# Patient Record
Sex: Female | Born: 1995 | Race: White | Hispanic: No | Marital: Single | Smoking: Current every day smoker
Health system: Southern US, Community
[De-identification: ages and names within clinical notes are randomized; demographics above are authoritative.]

## PROBLEM LIST (undated history)

## (undated) DIAGNOSIS — N898 Other specified noninflammatory disorders of vagina: Secondary | ICD-10-CM

## (undated) DIAGNOSIS — Z8619 Personal history of other infectious and parasitic diseases: Secondary | ICD-10-CM

## (undated) DIAGNOSIS — B9689 Other specified bacterial agents as the cause of diseases classified elsewhere: Secondary | ICD-10-CM

## (undated) DIAGNOSIS — Z319 Encounter for procreative management, unspecified: Secondary | ICD-10-CM

## (undated) DIAGNOSIS — N760 Acute vaginitis: Secondary | ICD-10-CM

## (undated) HISTORY — DX: Personal history of other infectious and parasitic diseases: Z86.19

## (undated) HISTORY — DX: Encounter for procreative management, unspecified: Z31.9

## (undated) HISTORY — DX: Other specified bacterial agents as the cause of diseases classified elsewhere: B96.89

## (undated) HISTORY — DX: Acute vaginitis: N76.0

## (undated) HISTORY — DX: Other specified noninflammatory disorders of vagina: N89.8

---
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
## 2013-10-19 ENCOUNTER — Encounter (HOSPITAL_COMMUNITY): Payer: Self-pay | Admitting: Emergency Medicine

## 2013-10-19 ENCOUNTER — Emergency Department (HOSPITAL_COMMUNITY)
Admission: EM | Admit: 2013-10-19 | Discharge: 2013-10-19 | Disposition: A | Discharge status: Home / Self Care | Payer: Medicaid Other | Attending: Emergency Medicine | Admitting: Emergency Medicine

## 2013-10-19 DIAGNOSIS — Z3202 Encounter for pregnancy test, result negative: Secondary | ICD-10-CM | POA: Insufficient documentation

## 2013-10-19 DIAGNOSIS — F17200 Nicotine dependence, unspecified, uncomplicated: Secondary | ICD-10-CM | POA: Insufficient documentation

## 2013-10-19 DIAGNOSIS — Z202 Contact with and (suspected) exposure to infections with a predominantly sexual mode of transmission: Secondary | ICD-10-CM | POA: Insufficient documentation

## 2013-10-19 LAB — URINALYSIS, ROUTINE W REFLEX MICROSCOPIC
Bilirubin Urine: NEGATIVE
Protein, ur: NEGATIVE mg/dL
Urobilinogen, UA: 0.2 mg/dL (ref 0.0–1.0)

## 2013-10-19 LAB — WET PREP, GENITAL
Clue Cells Wet Prep HPF POC: NONE SEEN
Trich, Wet Prep: NONE SEEN

## 2013-10-19 LAB — URINE MICROSCOPIC-ADD ON

## 2013-10-19 MED ORDER — LIDOCAINE HCL (PF) 1 % IJ SOLN
INTRAMUSCULAR | Status: AC
  Administered 2013-10-19: 5 mL
  Filled 2013-10-19: qty 5

## 2013-10-19 MED ORDER — AZITHROMYCIN 250 MG PO TABS
1000.0000 mg | ORAL_TABLET | Freq: Once | ORAL | Status: AC
  Administered 2013-10-19: 1000 mg via ORAL
  Filled 2013-10-19: qty 4

## 2013-10-19 MED ORDER — CEFTRIAXONE SODIUM 250 MG IJ SOLR
250.0000 mg | Freq: Once | INTRAMUSCULAR | Status: AC
  Administered 2013-10-19: 250 mg via INTRAMUSCULAR
  Filled 2013-10-19: qty 250

## 2013-10-19 NOTE — ED Notes (Signed)
Pt says her ex boyfriend tested pos for chladymia,  .  Had brown d/c and itches

## 2013-10-19 NOTE — ED Notes (Signed)
Patient reports she was contact by ex-boyfriend, stating that he had tested positive for chlamydia. Patient does report brown discharge and itching. Denies any lesions. Per patient thought it was yeast infection, in which she used over-the-counter medication. Per patient some relief in itching.

## 2013-10-19 NOTE — ED Provider Notes (Signed)
Medical screening examination/treatment/procedure(s) were performed by non-physician practitioner and as supervising physician I was immediately available for consultation/collaboration.  EKG Interpretation   None         Shelda Jakes, MD 10/19/13 1434

## 2013-10-19 NOTE — ED Provider Notes (Signed)
CSN: [id number]     Arrival date & time 10/19/13  0944 History   First MD Initiated Contact with Patient 10/19/13 1019     Chief Complaint  Patient presents with  . Exposure to STD   (Consider location/radiation/quality/duration/timing/severity/associated sxs/prior Treatment) Patient is a 17 y.o. female presenting with STD exposure. The history is provided by the patient.  Exposure to STD Pertinent negatives include no abdominal pain, arthralgias, chest pain, coughing or neck pain.    History reviewed. No pertinent past medical history. History reviewed. No pertinent past surgical history. History reviewed. No pertinent family history. History  Substance Use Topics  . Smoking status: Current Every Day Smoker -- 1.00 packs/day for 8 years    Types: Cigarettes  . Smokeless tobacco: Never Used  . Alcohol Use: No   OB History   Grav Para Term Preterm Abortions TAB SAB Ect Mult Living   1 1  1      1      Review of Systems  Constitutional: Negative for activity change.       All ROS Neg except as noted in HPI  HENT: Negative for nosebleeds.   Eyes: Negative for photophobia and discharge.  Respiratory: Negative for cough, shortness of breath and wheezing.   Cardiovascular: Negative for chest pain and palpitations.  Gastrointestinal: Negative for abdominal pain and blood in stool.  Genitourinary: Negative for dysuria, frequency and hematuria.  Musculoskeletal: Negative for arthralgias, back pain and neck pain.  Skin: Negative.   Neurological: Negative for dizziness, seizures and speech difficulty.  Psychiatric/Behavioral: Negative for hallucinations and confusion.    Allergies  Review of patient's allergies indicates no known allergies.  Home Medications  No current outpatient prescriptions on file. BP 127/61  Pulse 77  Temp(Src) 97.8 F (36.6 C) (Oral)  Resp 18  Ht 5\' 2"  (1.575 m)  Wt 170 lb (77.111 kg)  BMI 31.09 kg/m2  SpO2 100%  LMP 09/22/2013 Physical Exam   Nursing note and vitals reviewed. Constitutional: She is oriented to person, place, and time. She appears well-developed and well-nourished.  Non-toxic appearance.  HENT:  Head: Normocephalic.  Right Ear: Tympanic membrane and external ear normal.  Left Ear: Tympanic membrane and external ear normal.  Eyes: EOM and lids are normal. Pupils are equal, round, and reactive to light.  Neck: Normal range of motion. Neck supple. Carotid bruit is not present.  Cardiovascular: Normal rate, regular rhythm, normal heart sounds, intact distal pulses and normal pulses.   Pulmonary/Chest: Breath sounds normal. No respiratory distress.  Abdominal: Soft. Bowel sounds are normal. There is no tenderness. There is no guarding.  Genitourinary:  Chaperone present during examination. The external vaginal examination is well within normal limits. There is a small amount of blood in the vaginal vault, but no foreign body. The os of the cervix is closed. There is mucous that is blood-tinged present. The patient informs me that she is at the beginning of her menstrual cycle. There is no significant tenderness with cervical wall motion. There is mild to moderate left adnexal tenderness present.  Musculoskeletal: Normal range of motion.  Lymphadenopathy:       Head (right side): No submandibular adenopathy present.       Head (left side): No submandibular adenopathy present.    She has no cervical adenopathy.  Neurological: She is alert and oriented to person, place, and time. She has normal strength. No cranial nerve deficit or sensory deficit.  Skin: Skin is warm and dry.  Psychiatric: She  has a normal mood and affect. Her speech is normal.    ED Course  Procedures (including critical care time) Labs Review Labs Reviewed  URINALYSIS, ROUTINE W REFLEX MICROSCOPIC  POCT PREGNANCY, URINE   Imaging Review No results found.  EKG Interpretation   None       MDM  No diagnosis found. **I have reviewed  nursing notes, vital signs, and all appropriate lab and imaging results for this patient.*  Patient states that an ex-boyfriend told her he tested positive for Chlamydia. She presents to the emergency department for additional testing. Patient also states that she has a discharge present that she thought was yeast but is not really responding well to over-the-counter medications. The patient also states that she has just started her menstrual cycle.  Gonorrhea and Chlamydia cultures as well as wet prep were obtained. Patient was treated in the emergency department with Rocephin, and Zithromax. Patient advised to be seen at the health department for additional hepatitis HIV testing, etc.  Kathie Dike, PA-C 10/19/13 1416

## 2013-10-21 LAB — URINE CULTURE

## 2013-10-22 NOTE — ED Notes (Signed)
+   Chlamydia Patient treated with Rocephin And Zithromax-[facility] faxed 

## 2013-10-23 ENCOUNTER — Telehealth (HOSPITAL_COMMUNITY): Payer: Self-pay | Admitting: Emergency Medicine

## 2013-11-09 ENCOUNTER — Other Ambulatory Visit: Payer: Self-pay | Admitting: Obstetrics and Gynecology

## 2014-01-22 ENCOUNTER — Ambulatory Visit: Payer: Self-pay | Admitting: Adult Health

## 2014-01-27 ENCOUNTER — Encounter (INDEPENDENT_AMBULATORY_CARE_PROVIDER_SITE_OTHER): Payer: Self-pay

## 2014-01-27 ENCOUNTER — Ambulatory Visit (INDEPENDENT_AMBULATORY_CARE_PROVIDER_SITE_OTHER): Payer: Medicaid Other | Admitting: Adult Health

## 2014-01-27 ENCOUNTER — Encounter: Payer: Self-pay | Admitting: Adult Health

## 2014-01-27 VITALS — BP 128/60 | Ht 63.5 in | Wt 177.0 lb

## 2014-01-27 DIAGNOSIS — Z319 Encounter for procreative management, unspecified: Secondary | ICD-10-CM

## 2014-01-27 DIAGNOSIS — N898 Other specified noninflammatory disorders of vagina: Secondary | ICD-10-CM | POA: Insufficient documentation

## 2014-01-27 HISTORY — DX: Encounter for procreative management, unspecified: Z31.9

## 2014-01-27 HISTORY — DX: Other specified noninflammatory disorders of vagina: N89.8

## 2014-01-27 MED ORDER — PRENATAL VITAMINS PLUS 27-1 MG PO TABS: ORAL_TABLET | ORAL | Status: DC

## 2014-01-27 NOTE — Patient Instructions (Signed)
Discussed timing of sex Take prenatal vitamins Try to decrease cigarettes Return in 4 weeks

## 2014-01-27 NOTE — Progress Notes (Signed)
Subjective:     Patient ID: Lisa Farrell, female   DOB: May 22, 1996, 18 y.o.   MRN: [id number]  HPI Lowanda FosterBrittany is a 18 year old white female, single desires pregnancy and has discharge, was treated for chlamydia 09/2013 at clinic.Has 20 year old son.Used depo in past has not used anything in about 3 years, periods are regular.  Review of Systems See HPI Reviewed past medical,surgical, social and family history. Reviewed medications and allergies.     Objective:   Physical Exam BP 128/60  Ht 5' 3.5" (1.613 m)  Wt 177 lb (80.287 kg)  BMI 30.86 kg/m2  LMP 01/10/2013   Skin warm and dry.Pelvic: external genitalia is normal in appearance, vagina: watery discharge without odor, cervix:smooth and bulbous, uterus: normal size, shape and contour, non tender, no masses felt, adnexa: no masses or tenderness noted.Negative CMT. GC/CHL obtained.  Assessment:    Vaginal discharge Desires pregnancy     Plan:    Discussed timing of sex rx prenatal plus 1 daily x 1 year Review handout on fertility Try to decrease smoking   Return in 4 weeks to discuss

## 2014-01-28 ENCOUNTER — Telehealth: Payer: Self-pay | Admitting: Adult Health

## 2014-01-28 LAB — GC/CHLAMYDIA PROBE AMP
CT PROBE, AMP APTIMA: NEGATIVE
GC Probe RNA: NEGATIVE

## 2014-01-28 NOTE — Telephone Encounter (Signed)
Pt aware of labs  

## 2014-02-26 ENCOUNTER — Ambulatory Visit: Payer: Medicaid Other | Admitting: Adult Health

## 2014-05-21 ENCOUNTER — Encounter: Payer: Self-pay | Admitting: Adult Health

## 2014-05-21 ENCOUNTER — Ambulatory Visit (INDEPENDENT_AMBULATORY_CARE_PROVIDER_SITE_OTHER): Payer: Medicaid Other | Admitting: Adult Health

## 2014-05-21 VITALS — BP 122/60 | Ht 62.0 in | Wt 173.0 lb

## 2014-05-21 DIAGNOSIS — A499 Bacterial infection, unspecified: Secondary | ICD-10-CM

## 2014-05-21 DIAGNOSIS — N760 Acute vaginitis: Secondary | ICD-10-CM

## 2014-05-21 DIAGNOSIS — B9689 Other specified bacterial agents as the cause of diseases classified elsewhere: Secondary | ICD-10-CM

## 2014-05-21 DIAGNOSIS — N898 Other specified noninflammatory disorders of vagina: Secondary | ICD-10-CM

## 2014-05-21 HISTORY — DX: Other specified bacterial agents as the cause of diseases classified elsewhere: B96.89

## 2014-05-21 LAB — POCT WET PREP (WET MOUNT)
Trichomonas Wet Prep HPF POC: NEGATIVE
WBC WET PREP: POSITIVE

## 2014-05-21 MED ORDER — METRONIDAZOLE 500 MG PO TABS: 500.0000 mg | ORAL_TABLET | Freq: Two times a day (BID) | ORAL | Status: DC

## 2014-05-21 NOTE — Progress Notes (Signed)
Subjective:     Patient ID: Kern ReapBrittany N Reich, female   DOB: September 07, 1996, 18 y.o.   MRN: [id number]  HPI Lowanda FosterBrittany is a 18 year old white female in wanting to get checked,has had exposure to trich , and complains of discharge with odor.  Review of Systems See HPI Reviewed past medical,surgical, social and family history. Reviewed medications and allergies.     Objective:   Physical Exam BP 122/60  Ht 5\' 2"  (1.575 m)  Wt 173 lb (78.472 kg)  BMI 31.63 kg/m2  LMP 04/22/2014   Skin warm and dry.Pelvic: external genitalia is normal in appearance, vagina: white, slightly frothy discharge with odor, cervix:smooth, uterus: normal size, shape and contour, non tender, no masses felt, adnexa: no masses or tenderness noted. Wet prep: + for clue cells and +WBCs.No trich GC/CHL obtained.   Assessment:     Vaginal discharge BV    Plan:    GC/CHL sent Rx flagyl 500 mg 1 bid x 7 days, no alcohol, review handout on BV, no sex white on meds.   Follow up prn

## 2014-05-21 NOTE — Patient Instructions (Signed)
Bacterial Vaginosis Bacterial vaginosis is a vaginal infection that occurs when the normal balance of bacteria in the vagina is disrupted. It results from an overgrowth of certain bacteria. This is the most common vaginal infection in women of childbearing age. Treatment is important to prevent complications, especially in pregnant women, as it can cause a premature delivery. CAUSES  Bacterial vaginosis is caused by an increase in harmful bacteria that are normally present in smaller amounts in the vagina. Several different kinds of bacteria can cause bacterial vaginosis. However, the reason that the condition develops is not fully understood. RISK FACTORS Certain activities or behaviors can put you at an increased risk of developing bacterial vaginosis, including:  Having a new sex partner or multiple sex partners.  Douching.  Using an intrauterine device (IUD) for contraception. Women do not get bacterial vaginosis from toilet seats, bedding, swimming pools, or contact with objects around them. SIGNS AND SYMPTOMS  Some women with bacterial vaginosis have no signs or symptoms. Common symptoms include:  Grey vaginal discharge.  A fishlike odor with discharge, especially after sexual intercourse.  Itching or burning of the vagina and vulva.  Burning or pain with urination. DIAGNOSIS  Your health care provider will take a medical history and examine the vagina for signs of bacterial vaginosis. A sample of vaginal fluid may be taken. Your health care provider will look at this sample under a microscope to check for bacteria and abnormal cells. A vaginal pH test may also be done.  TREATMENT  Bacterial vaginosis may be treated with antibiotic medicines. These may be given in the form of a pill or a vaginal cream. A second round of antibiotics may be prescribed if the condition comes back after treatment.  HOME CARE INSTRUCTIONS   Only take over-the-counter or prescription medicines as  directed by your health care provider.  If antibiotic medicine was prescribed, take it as directed. Make sure you finish it even if you start to feel better.  Do not have sex until treatment is completed.  Tell all sexual partners that you have a vaginal infection. They should see their health care provider and be treated if they have problems, such as a mild rash or itching.  Practice safe sex by using condoms and only having one sex partner. SEEK MEDICAL CARE IF:   Your symptoms are not improving after 3 days of treatment.  You have increased discharge or pain.  You have a fever. MAKE SURE YOU:   Understand these instructions.  Will watch your condition.  Will get help right away if you are not doing well or get worse. FOR MORE INFORMATION  Centers for Disease Control and Prevention, Division of STD Prevention: SolutionApps.co.zawww.cdc.gov/std American Sexual Health Association (ASHA): www.ashastd.org  Document Released: 10/29/2005 Document Revised: 08/19/2013 Document Reviewed: 06/10/2013 [facility] Patient Information 2015 [place], [place]. This information is not intended to replace advice given to you by your health care provider. Make sure you discuss any questions you have with your health care provider. Take flagyl no sex no alcohol while taking meds

## 2014-05-22 LAB — GC/CHLAMYDIA PROBE AMP
CT PROBE, AMP APTIMA: NEGATIVE
GC Probe RNA: NEGATIVE

## 2014-05-24 ENCOUNTER — Telehealth: Payer: Self-pay | Admitting: Adult Health

## 2014-05-24 NOTE — Telephone Encounter (Signed)
[facility] GC/CHl negative

## 2014-09-13 ENCOUNTER — Encounter: Payer: Self-pay | Admitting: Adult Health

## 2014-10-12 ENCOUNTER — Encounter (HOSPITAL_COMMUNITY): Payer: Self-pay | Admitting: Emergency Medicine

## 2014-10-12 ENCOUNTER — Emergency Department (HOSPITAL_COMMUNITY)
Admission: EM | Admit: 2014-10-12 | Discharge: 2014-10-12 | Disposition: A | Discharge status: Home / Self Care | Payer: Medicaid Other | Attending: Emergency Medicine | Admitting: Emergency Medicine

## 2014-10-12 DIAGNOSIS — J039 Acute tonsillitis, unspecified: Secondary | ICD-10-CM | POA: Insufficient documentation

## 2014-10-12 DIAGNOSIS — Z8619 Personal history of other infectious and parasitic diseases: Secondary | ICD-10-CM | POA: Diagnosis not present

## 2014-10-12 DIAGNOSIS — J029 Acute pharyngitis, unspecified: Secondary | ICD-10-CM | POA: Diagnosis present

## 2014-10-12 DIAGNOSIS — Z720 Tobacco use: Secondary | ICD-10-CM | POA: Diagnosis not present

## 2014-10-12 DIAGNOSIS — Z87440 Personal history of urinary (tract) infections: Secondary | ICD-10-CM | POA: Diagnosis not present

## 2014-10-12 LAB — RAPID STREP SCREEN (MED CTR MEBANE ONLY): Streptococcus, Group A Screen (Direct): NEGATIVE

## 2014-10-12 MED ORDER — AMOXICILLIN 500 MG PO CAPS: 500.0000 mg | ORAL_CAPSULE | Freq: Three times a day (TID) | ORAL | Status: DC

## 2014-10-12 MED ORDER — MAGIC MOUTHWASH W/LIDOCAINE: 5.0000 mL | Freq: Three times a day (TID) | ORAL | Status: DC | PRN

## 2014-10-12 NOTE — Discharge Instructions (Signed)
Tonsillitis °Tonsillitis is an infection of the throat. This infection causes the tonsils to become red, tender, and puffy (swollen). Tonsils are groups of tissue at the back of your throat. If bacteria caused your infection, antibiotic medicine will be given to you. Sometimes symptoms of tonsillitis can be relieved with the use of steroid medicine. If your tonsillitis is severe and happens often, you may need to get your tonsils removed (tonsillectomy). °HOME CARE  °· Rest and sleep often. °· Drink enough fluids to keep your pee (urine) clear or pale yellow. °· While your throat is sore, eat soft or liquid foods like: °¨ Soup. °¨ Ice cream. °¨ Instant breakfast drinks. °· Eat frozen ice pops. °· Gargle with a warm or cold liquid to help soothe the throat. Gargle with a water and salt mix. Mix 1/4 teaspoon of salt and 1/4 teaspoon of baking soda in 1 cup of water. °· Only take medicines as told by your doctor. °· If you are given medicines (antibiotics), take them as told. Finish them even if you start to feel better. °GET HELP IF: °· You have large, tender lumps in your neck. °· You have a rash. °· You cough up green, yellow-brown, or bloody fluid. °· You cannot swallow liquids or food for 24 hours. °· You notice that only one of your tonsils is swollen. °GET HELP RIGHT AWAY IF:  °· You throw up (vomit). °· You have a very bad headache. °· You have a stiff neck. °· You have chest pain. °· You have trouble breathing or swallowing. °· You have bad throat pain, drooling, or your voice changes. °· You have bad pain not helped by medicine. °· You cannot fully open your mouth. °· You have redness, puffiness, or bad pain in the neck. °· You have a fever. °MAKE SURE YOU:  °· Understand these instructions. °· Will watch your condition. °· Will get help right away if you are not doing well or get worse. °Document Released: 04/16/2008 Document Revised: 11/03/2013 Document Reviewed: 04/17/2013 °ExitCare® Patient Information  ©2015 [employer] LLC. This information is not intended to replace advice given to you by your health care provider. Make sure you discuss any questions you have with your health care provider. ° °

## 2014-10-12 NOTE — ED Notes (Signed)
Pt reports sore throat x3 days, body aches, intermittent fever. nad noted.

## 2014-10-13 NOTE — ED Provider Notes (Signed)
CSN: [id number]     Arrival date & time 10/12/14  1620 History   First MD Initiated Contact with Patient 10/12/14 1654     Chief Complaint  Patient presents with  . Sore Throat     (Consider location/radiation/quality/duration/timing/severity/associated sxs/prior Treatment) HPI   Kern ReapBrittany N Eschbach is a 18 y.o. female who presents to the Emergency Department complaining of sore throat, fever and body aches for 3 days.  She also reports pain with swallowing and reports family member with similar symptoms.  She has been taking OTC medications without relief.  She denies vomiting, cough, neck pain or stiffness or rash.     Past Medical History  Diagnosis Date  . History of chlamydia   . Patient desires pregnancy 01/27/2014  . Vaginal discharge 01/27/2014  . BV (bacterial vaginosis) 05/21/2014   History reviewed. No pertinent past surgical history. Family History  Problem Relation Age of Onset  . Diabetes Father   . Bipolar disorder Brother    History  Substance Use Topics  . Smoking status: Current Every Day Smoker -- 1.00 packs/day for 8 years    Types: Cigarettes  . Smokeless tobacco: Never Used  . Alcohol Use: No   OB History    Gravida Para Term Preterm AB TAB SAB Ectopic Multiple Living   1 1  1      1      Review of Systems  Constitutional: Negative for fever, chills, activity change and appetite change.  HENT: Positive for congestion and sore throat. Negative for ear pain, facial swelling, trouble swallowing and voice change.   Eyes: Negative for pain and visual disturbance.  Respiratory: Negative for cough and shortness of breath.   Gastrointestinal: Negative for nausea, vomiting and abdominal pain.  Musculoskeletal: Negative for arthralgias, neck pain and neck stiffness.  Skin: Negative for color change and rash.  Neurological: Negative for dizziness, facial asymmetry, speech difficulty, numbness and headaches.  Hematological: Negative for adenopathy.  All other  systems reviewed and are negative.     Allergies  Review of patient's allergies indicates no known allergies.  Home Medications   Prior to Admission medications   Medication Sig Start Date End Date Taking? Authorizing Provider  Alum & Mag Hydroxide-Simeth (MAGIC MOUTHWASH W/LIDOCAINE) SOLN Take 5 mLs by mouth 3 (three) times daily as needed for mouth pain. Swish and spit, do not swallow 10/12/14   Tiajuana Leppanen L. Keynan Heffern, PA-C  amoxicillin (AMOXIL) 500 MG capsule Take 1 capsule (500 mg total) by mouth 3 (three) times daily. For 10 days 10/12/14   Rainee Sweatt L. Rogelio Waynick, PA-C   BP 133/66 mmHg  Pulse 85  Temp(Src) 99 F (37.2 C) (Oral)  Resp 18  Ht 5\' 2"  (1.575 m)  Wt 170 lb (77.111 kg)  BMI 31.09 kg/m2  SpO2 100%  LMP 09/28/2014 Physical Exam  Constitutional: She is oriented to person, place, and time. She appears well-developed and well-nourished. No distress.  HENT:  Head: Normocephalic and atraumatic.  Right Ear: Tympanic membrane and ear canal normal.  Left Ear: Tympanic membrane and ear canal normal.  Mouth/Throat: Uvula is midline and mucous membranes are normal. No oral lesions. No trismus in the jaw. No uvula swelling. Oropharyngeal exudate, posterior oropharyngeal edema and posterior oropharyngeal erythema present. No tonsillar abscesses.  Neck: Normal range of motion and phonation normal. Neck supple. No Kernig's sign noted.  Cardiovascular: Normal rate, regular rhythm, normal heart sounds and intact distal pulses.   No murmur heard. Pulmonary/Chest: Effort normal and breath sounds normal.  No respiratory distress.  Abdominal: There is no splenomegaly. There is no tenderness.  Musculoskeletal: Normal range of motion.  Lymphadenopathy:    She has cervical adenopathy.       Right cervical: Superficial cervical adenopathy present.       Left cervical: Superficial cervical adenopathy present.  Neurological: She is alert and oriented to person, place, and time. She exhibits normal  muscle tone. Coordination normal.  Skin: Skin is warm and dry.  Nursing note and vitals reviewed.   ED Course  Procedures (including critical care time) Labs Review Labs Reviewed  RAPID STREP SCREEN  CULTURE, GROUP A STREP    Imaging Review No results found.   EKG Interpretation None      MDM   Final diagnoses:  Tonsillitis    Pt is well appearing.  VSS.  No PTA.  Agrees to fluids, rx for magic mouthwash and amoxil.      Destiny Hagin L. Trisha Mangleriplett, PA-C 10/13/14 [id number]  Geoffery Lyonsouglas Delo, MD 10/13/14 2249

## 2014-10-14 LAB — CULTURE, GROUP A STREP

## 2015-02-10 ENCOUNTER — Emergency Department (HOSPITAL_COMMUNITY): Payer: Medicaid Other

## 2015-02-10 ENCOUNTER — Encounter (HOSPITAL_COMMUNITY): Payer: Self-pay | Admitting: Emergency Medicine

## 2015-02-10 ENCOUNTER — Emergency Department (HOSPITAL_COMMUNITY)
Admission: EM | Admit: 2015-02-10 | Discharge: 2015-02-10 | Disposition: A | Discharge status: Home / Self Care | Payer: Medicaid Other | Attending: Emergency Medicine | Admitting: Emergency Medicine

## 2015-02-10 DIAGNOSIS — Z792 Long term (current) use of antibiotics: Secondary | ICD-10-CM | POA: Insufficient documentation

## 2015-02-10 DIAGNOSIS — N730 Acute parametritis and pelvic cellulitis: Secondary | ICD-10-CM | POA: Diagnosis not present

## 2015-02-10 DIAGNOSIS — Z720 Tobacco use: Secondary | ICD-10-CM | POA: Diagnosis not present

## 2015-02-10 DIAGNOSIS — Z3202 Encounter for pregnancy test, result negative: Secondary | ICD-10-CM | POA: Insufficient documentation

## 2015-02-10 DIAGNOSIS — R102 Pelvic and perineal pain: Secondary | ICD-10-CM

## 2015-02-10 DIAGNOSIS — Z79899 Other long term (current) drug therapy: Secondary | ICD-10-CM | POA: Diagnosis not present

## 2015-02-10 DIAGNOSIS — N7093 Salpingitis and oophoritis, unspecified: Secondary | ICD-10-CM

## 2015-02-10 DIAGNOSIS — Z8619 Personal history of other infectious and parasitic diseases: Secondary | ICD-10-CM | POA: Insufficient documentation

## 2015-02-10 DIAGNOSIS — R109 Unspecified abdominal pain: Secondary | ICD-10-CM | POA: Diagnosis present

## 2015-02-10 LAB — URINALYSIS, ROUTINE W REFLEX MICROSCOPIC
Bilirubin Urine: NEGATIVE
GLUCOSE, UA: NEGATIVE mg/dL
Ketones, ur: NEGATIVE mg/dL
Nitrite: NEGATIVE
PH: 6 (ref 5.0–8.0)
PROTEIN: NEGATIVE mg/dL
Specific Gravity, Urine: 1.025 (ref 1.005–1.030)
Urobilinogen, UA: 0.2 mg/dL (ref 0.0–1.0)

## 2015-02-10 LAB — CBC WITH DIFFERENTIAL/PLATELET
BASOS PCT: 0 % (ref 0–1)
Basophils Absolute: 0.1 10*3/uL (ref 0.0–0.1)
EOS ABS: 0.1 10*3/uL (ref 0.0–0.7)
EOS PCT: 1 % (ref 0–5)
HEMATOCRIT: 39.3 % (ref 36.0–46.0)
HEMOGLOBIN: 13.2 g/dL (ref 12.0–15.0)
LYMPHS ABS: 2.2 10*3/uL (ref 0.7–4.0)
LYMPHS PCT: 13 % (ref 12–46)
MCH: 33 pg (ref 26.0–34.0)
MCHC: 33.6 g/dL (ref 30.0–36.0)
MCV: 98.3 fL (ref 78.0–100.0)
Monocytes Absolute: 1.3 10*3/uL — ABNORMAL HIGH (ref 0.1–1.0)
Monocytes Relative: 8 % (ref 3–12)
Neutro Abs: 13.5 10*3/uL — ABNORMAL HIGH (ref 1.7–7.7)
Neutrophils Relative %: 78 % — ABNORMAL HIGH (ref 43–77)
PLATELETS: 310 10*3/uL (ref 150–400)
RBC: 4 MIL/uL (ref 3.87–5.11)
RDW: 13.1 % (ref 11.5–15.5)
WBC: 17.1 10*3/uL — AB (ref 4.0–10.5)

## 2015-02-10 LAB — WET PREP, GENITAL
Trich, Wet Prep: NONE SEEN
Yeast Wet Prep HPF POC: NONE SEEN

## 2015-02-10 LAB — BASIC METABOLIC PANEL
Anion gap: 6 (ref 5–15)
BUN: 11 mg/dL (ref 6–23)
CALCIUM: 8.9 mg/dL (ref 8.4–10.5)
CO2: 26 mmol/L (ref 19–32)
Chloride: 106 mmol/L (ref 96–112)
Creatinine, Ser: 0.77 mg/dL (ref 0.50–1.10)
GLUCOSE: 103 mg/dL — AB (ref 70–99)
POTASSIUM: 3.9 mmol/L (ref 3.5–5.1)
SODIUM: 138 mmol/L (ref 135–145)

## 2015-02-10 LAB — URINE MICROSCOPIC-ADD ON

## 2015-02-10 LAB — POC URINE PREG, ED: PREG TEST UR: NEGATIVE

## 2015-02-10 MED ORDER — DOXYCYCLINE HYCLATE 100 MG PO CAPS: 100.0000 mg | ORAL_CAPSULE | Freq: Two times a day (BID) | ORAL | Status: DC

## 2015-02-10 MED ORDER — GADOBENATE DIMEGLUMINE 529 MG/ML IV SOLN
16.0000 mL | Freq: Once | INTRAVENOUS | Status: AC | PRN
  Administered 2015-02-10: 16 mL via INTRAVENOUS

## 2015-02-10 MED ORDER — OXYCODONE-ACETAMINOPHEN 5-325 MG PO TABS: 1.0000 | ORAL_TABLET | Freq: Four times a day (QID) | ORAL | Status: DC | PRN

## 2015-02-10 MED ORDER — STERILE WATER FOR INJECTION IJ SOLN
INTRAMUSCULAR | Status: AC
Start: 2015-02-10 — End: 2015-02-10
  Administered 2015-02-10: 10 mL
  Filled 2015-02-10: qty 10

## 2015-02-10 MED ORDER — CEFTRIAXONE SODIUM 250 MG IJ SOLR
250.0000 mg | Freq: Once | INTRAMUSCULAR | Status: AC
  Administered 2015-02-10: 250 mg via INTRAMUSCULAR
  Filled 2015-02-10: qty 250

## 2015-02-10 MED ORDER — HYDROCODONE-ACETAMINOPHEN 5-325 MG PO TABS
1.0000 | ORAL_TABLET | Freq: Once | ORAL | Status: AC
  Administered 2015-02-10: 1 via ORAL
  Filled 2015-02-10: qty 1

## 2015-02-10 MED ORDER — OXYCODONE-ACETAMINOPHEN 5-325 MG PO TABS
1.0000 | ORAL_TABLET | Freq: Once | ORAL | Status: AC
Start: 2015-02-10 — End: 2015-02-10
  Administered 2015-02-10: 1 via ORAL
  Filled 2015-02-10: qty 1

## 2015-02-10 MED ORDER — AZITHROMYCIN 250 MG PO TABS
1000.0000 mg | ORAL_TABLET | Freq: Once | ORAL | Status: AC
Start: 2015-02-10 — End: 2015-02-10
  Administered 2015-02-10: 1000 mg via ORAL
  Filled 2015-02-10: qty 4

## 2015-02-10 NOTE — ED Provider Notes (Signed)
CSN: [id number]     Arrival date & time 02/10/15  1315 History  This chart was scribed for Shon Batonourtney F Horton, MD by Ronney LionSuzanne Le, ED Scribe. This patient was seen in room APA11/APA11 and the patient's care was started at 1:25 PM.     Chief Complaint  Patient presents with  . Abdominal Pain   Patient is a 19 y.o. female presenting with abdominal pain. The history is provided by the patient. No language interpreter was used.  Abdominal Pain Associated symptoms: no chest pain, no constipation, no diarrhea, no dysuria, no fever, no hematuria, no nausea, no shortness of breath, no vaginal bleeding, no vaginal discharge and no vomiting     HPI Comments: Kern ReapBrittany N Belk is a 19 y.o. female who presents to the Emergency Department complaining of constant, cramping, bilateral suprapubic pain that began 3 days ago.[facility], was diagnosed with a UTI, and was prescribed antibiotics. Patient reports her symptoms are similar to having a UTI. She finished the course of antibiotics and her symptoms improved. She normal reports BMs 3-4 times a day. Applying pressure and sitting on the toilet alleviate the pain. She denies a history of chronic medical conditions. She denies vomiting, diarrhea, vaginal discharge, or vaginal bleeding. Patient's LMP occurred 2 weeks ago. Patient is sexually active and reports a history of smoking.  Past Medical History  Diagnosis Date  . History of chlamydia   . Patient desires pregnancy 01/27/2014  . Vaginal discharge 01/27/2014  . BV (bacterial vaginosis) 05/21/2014   History reviewed. No pertinent past surgical history. Family History  Problem Relation Age of Onset  . Diabetes Father   . Bipolar disorder Brother    History  Substance Use Topics  . Smoking status: Current Every Day Smoker -- 1.00 packs/day for 8 years    Types: Cigarettes  . Smokeless tobacco: Never Used  . Alcohol Use: No   OB History    Gravida Para Term Preterm  AB TAB SAB Ectopic Multiple Living   1 1  1      1      Review of Systems  Constitutional: Negative for fever.  Respiratory: Negative for chest tightness and shortness of breath.   Cardiovascular: Negative for chest pain.  Gastrointestinal: Positive for abdominal pain. Negative for nausea, vomiting, diarrhea and constipation.  Genitourinary: Negative for dysuria, hematuria, vaginal bleeding and vaginal discharge.  Musculoskeletal: Negative for back pain.  Skin: Negative for color change.  Neurological: Negative for headaches.  All other systems reviewed and are negative.     Allergies  Review of patient's allergies indicates no known allergies.  Home Medications   Prior to Admission medications   Medication Sig Start Date End Date Taking? Authorizing Provider  Alum & Mag Hydroxide-Simeth (MAGIC MOUTHWASH W/LIDOCAINE) SOLN Take 5 mLs by mouth 3 (three) times daily as needed for mouth pain. Swish and spit, do not swallow 10/12/14  Yes Tammi Triplett, PA-C  Ibuprofen (MIDOL PO) Take 2 tablets by mouth daily as needed (stomach pain).   Yes Historical Provider, MD  amoxicillin (AMOXIL) 500 MG capsule Take 1 capsule (500 mg total) by mouth 3 (three) times daily. For 10 days Patient not taking: Reported on 02/10/2015 10/12/14   Tammi Triplett, PA-C  doxycycline (VIBRAMYCIN) 100 MG capsule Take 1 capsule (100 mg total) by mouth 2 (two) times daily. 02/10/15   Shon Batonourtney F Horton, MD  oxyCODONE-acetaminophen (PERCOCET/ROXICET) 5-325 MG per tablet Take 1-2 tablets by mouth every 6 (  six) hours as needed for severe pain. 02/10/15   Shon Baton, MD   BP 123/62 mmHg  Pulse 77  Temp(Src) 97.9 F (36.6 C) (Oral)  Resp 16  Ht  (1.6 m)  Wt 170 lb (77.111 kg)  BMI 30.12 kg/m2  SpO2 100%  LMP 12/23/2014 Physical Exam  Constitutional: She is oriented to person, place, and time. She appears well-developed and well-nourished. No distress.  HENT:  Head: Normocephalic and atraumatic.   Cardiovascular: Normal rate, regular rhythm and normal heart sounds.   No murmur heard. Pulmonary/Chest: Effort normal and breath sounds normal. No respiratory distress. She has no wheezes.  Abdominal: Soft. Bowel sounds are normal. There is no tenderness. There is no rebound and no guarding.  Genitourinary: Vagina normal.  No friability to the cervix, moderate white vaginal discharge, cervical motion tenderness  Neurological: She is alert and oriented to person, place, and time.  Skin: Skin is warm and dry.  Psychiatric: She has a normal mood and affect.  Nursing note and vitals reviewed.   ED Course  Procedures (including critical care time)  DIAGNOSTIC STUDIES: Oxygen Saturation is 100% on room air, normal by my interpretation.      Labs Review Labs Reviewed  WET PREP, GENITAL - Abnormal; Notable for the following:    Clue Cells Wet Prep HPF POC FEW (*)    WBC, Wet Prep HPF POC MODERATE (*)    All other components within normal limits  URINALYSIS, ROUTINE W REFLEX MICROSCOPIC - Abnormal; Notable for the following:    APPearance HAZY (*)    Hgb urine dipstick SMALL (*)    Leukocytes, UA MODERATE (*)    All other components within normal limits  CBC WITH DIFFERENTIAL/PLATELET - Abnormal; Notable for the following:    WBC 17.1 (*)    Neutrophils Relative % 78 (*)    Neutro Abs 13.5 (*)    Monocytes Absolute 1.3 (*)    All other components within normal limits  BASIC METABOLIC PANEL - Abnormal; Notable for the following:    Glucose, Bld 103 (*)    All other components within normal limits  URINE MICROSCOPIC-ADD ON - Abnormal; Notable for the following:    Squamous Epithelial / LPF MANY (*)    Bacteria, UA MANY (*)    All other components within normal limits  URINE CULTURE  RPR  HIV ANTIBODY (ROUTINE TESTING)  POC URINE PREG, ED  GC/CHLAMYDIA PROBE AMP ([place])    Imaging Review Mr Pelvis W Wo Contrast  02/10/2015   CLINICAL DATA:  Pelvic pain in female for 3  days. Pelvic inflammatory disease. Bilateral adnexal mass seen on recent ultrasound.  EXAM: MRI PELVIS WITHOUT AND WITH CONTRAST  TECHNIQUE: Multiplanar multisequence MR imaging of the pelvis was performed both before and after administration of intravenous contrast.  CONTRAST:  16mL MULTIHANCE GADOBENATE DIMEGLUMINE 529 MG/ML IV SOLN  COMPARISON:  Ultrasound on 02/10/2015  FINDINGS: Uterus: Measures 9.4 cm in length. No fibroids or other masses identified.  Endometrium:  Appears thin and unremarkable.  Cervix:  Appears normal.  Right ovary: Appears enlarged, measuring approximately 4.5 x 3.3 cm. Heterogeneous enhancement with irregular central T1 hypo intense cystic area noted which measures approximately 2.8 by 1.7 cm. Adjacent soft tissue stranding and enhancement seen in the right adnexa. No hemorrhagic signal intensity identified. This is nonspecific but suspicious for a small tubo-ovarian abscess.  Left ovary: Appears enlarged are measuring at least 3.8 x 3.5 cm. A ovoid tubular complex  cystic lesion is seen just lateral to the left ovary. This demonstrates T1 hypointensity and wall enhancement and adjacent inflammatory changes. No hemorrhagic signal intensity identified. This is suspicious for a tubo-ovarian abscess.  Urinary Bladder:  Unremarkable.  Bowel: Visualized pelvic loops are unremarkable.  Other: Small amount of free fluid is seen in the pelvis with diffuse peritoneal enhancement, also suspicious for pelvic inflammatory disease.  IMPRESSION: Bilateral adnexal complex cystic lesions, as described above, with small amount of free fluid and peritoneal enhancement. These findings are suspicious for pelvic inflammatory disease with bilateral tubo-ovarian abscesses. Recommend clinical correlation, and post treatment follow-up by ultrasound or MRI to confirm resolution.  Normal appearance of uterus.   Electronically Signed   By: Myles Rosenthal M.D.   On: 02/10/2015 18:09   US Transvaginal  Non-ob  02/10/2015   CLINICAL DATA:  Lower abdominal pain for 3 days. Tubo-ovarian abscess. Pelvic mass.  EXAM: TRANSABDOMINAL AND TRANSVAGINAL ULTRASOUND OF PELVIS  TECHNIQUE: Both transabdominal and transvaginal ultrasound examinations of the pelvis were performed. Transabdominal technique was performed for global imaging of the pelvis including uterus, ovaries, adnexal regions, and pelvic cul-de-sac. It was necessary to proceed with endovaginal exam following the transabdominal exam to visualize the ovaries.  COMPARISON:  None  FINDINGS: Uterus  Measurements: 8 cm x 4.5 cm x 6 cm. No fibroids or other mass visualized.  Endometrium  Thickness: 4.1 mm.  No focal abnormality visualized.  Right ovary  Measurements: Enlarged, measuring 56 mm x 39 mm x 42 mm. Mildly echogenic round lesion is present in or immediately adjacent to the RIGHT ovary measuring 28 mm x 30 mm x 27 mm. No internal blood flow, most compatible with a hemorrhagic ovarian cyst or endometrioma. Normal appearance of the RIGHT ovary proper.  Left ovary  Measurements: 54 mm x 38 mm x 42 mm. Normal appearance/no adnexal mass.  Other findings  There is a fluid-filled structure cranial and lateral to the LEFT ovary on transabdominal imaging. This appears tortuous and measures 6 cm long axis  IMPRESSION: 1. Echogenic RIGHT ovarian lesion probably represents a hemorrhagic cyst or endometrioma and measures 28 mm x 30 mm x 27 mm. This can be further characterized on MRI recommended below. Otherwise a 6-12 week follow-up ultrasound is recommended. This recommendation follows the consensus statement: Management of Asymptomatic Ovarian and Other Adnexal Cysts Imaged at [place]: Society of Radiologists in Ultrasound Consensus Conference Statement. Radiology 2010; [phone]. 2. 6 cm hypoechoic mass in the LEFT superior anatomic pelvis adjacent to the LEFT ovary is indeterminate and only well seen on transvaginal imaging. Pelvic MRI is the test of choice for further  assessment, with and without contrast. Non-emergent MRI should be deferred until patient has been discharged for the acute illness, and can optimally cooperate with positioning and breath-holding instructions. 3. Normal appearance of the uterus.   Electronically Signed   By: Andreas Newport M.D.   On: 02/10/2015 15:40   US Pelvis Complete  02/10/2015   CLINICAL DATA:  Lower abdominal pain for 3 days. Tubo-ovarian abscess. Pelvic mass.  EXAM: TRANSABDOMINAL AND TRANSVAGINAL ULTRASOUND OF PELVIS  TECHNIQUE: Both transabdominal and transvaginal ultrasound examinations of the pelvis were performed. Transabdominal technique was performed for global imaging of the pelvis including uterus, ovaries, adnexal regions, and pelvic cul-de-sac. It was necessary to proceed with endovaginal exam following the transabdominal exam to visualize the ovaries.  COMPARISON:  None  FINDINGS: Uterus  Measurements: 8 cm x 4.5 cm x 6 cm. No fibroids or other  mass visualized.  Endometrium  Thickness: 4.1 mm.  No focal abnormality visualized.  Right ovary  Measurements: Enlarged, measuring 56 mm x 39 mm x 42 mm. Mildly echogenic round lesion is present in or immediately adjacent to the RIGHT ovary measuring 28 mm x 30 mm x 27 mm. No internal blood flow, most compatible with a hemorrhagic ovarian cyst or endometrioma. Normal appearance of the RIGHT ovary proper.  Left ovary  Measurements: 54 mm x 38 mm x 42 mm. Normal appearance/no adnexal mass.  Other findings  There is a fluid-filled structure cranial and lateral to the LEFT ovary on transabdominal imaging. This appears tortuous and measures 6 cm long axis  IMPRESSION: 1. Echogenic RIGHT ovarian lesion probably represents a hemorrhagic cyst or endometrioma and measures 28 mm x 30 mm x 27 mm. This can be further characterized on MRI recommended below. Otherwise a 6-12 week follow-up ultrasound is recommended. This recommendation follows the consensus statement: Management of Asymptomatic  Ovarian and Other Adnexal Cysts Imaged at [place]: Society of Radiologists in Ultrasound Consensus Conference Statement. Radiology 2010; [phone]. 2. 6 cm hypoechoic mass in the LEFT superior anatomic pelvis adjacent to the LEFT ovary is indeterminate and only well seen on transvaginal imaging. Pelvic MRI is the test of choice for further assessment, with and without contrast. Non-emergent MRI should be deferred until patient has been discharged for the acute illness, and can optimally cooperate with positioning and breath-holding instructions. 3. Normal appearance of the uterus.   Electronically Signed   By: Andreas Newport M.D.   On: 02/10/2015 15:40     EKG Interpretation None      MDM   Final diagnoses:  PID (acute pelvic inflammatory disease)  Tubo-ovarian abscess   Patient presents with lower abdominal pain.  Nontoxic on exam. Cervical motion tenderness on GU exam with moderate vaginal discharge. Patient treated with IM Rocephin and azithromycin for STDs and cultures are pending. Ultrasound shows fluid collection which is difficult to characterize. Recommend an MRI. MRI suspicious for changes concerning for PID and possible bilateral tubo-ovarian abscess. Discussed patient with Dr. Despina Hidden.  Given that she is afebrile, he feel she can be trialed with outpatient management and that she likely has bilateral hydrosalpinx and not abscesses. Discussed this with patient. She will be discharged with doxycycline twice a day for 14 days. She's to follow-up with Dr. Despina Hidden 1 week.  After history, exam, and medical workup I feel the patient has been appropriately medically screened and is safe for discharge home. Pertinent diagnoses were discussed with the patient. Patient was given return precautions.  I personally performed the services described in this documentation, which was scribed in my presence. The recorded information has been reviewed and is accurate.     Shon Baton, MD 02/10/15 2041

## 2015-02-10 NOTE — ED Notes (Signed)
Pt resting.  Given ice pack as requested and a warm blanket.  Pt states she is going to nap until ultrasound results.

## 2015-02-10 NOTE — ED Notes (Signed)
Pt reports pain in her lower abdomen x 3 days. Denies blood in her urine. Reports cramping sensation.

## 2015-02-10 NOTE — Discharge Instructions (Signed)
You were seen today for lower abdominal pain. Your pain appears to be related to pelvic inflammatory disease and possible tubo-ovarian abscess. You need to follow-up with Dr. Despina HiddenEure in one week.  You should take antibiotics as directed. If he develops fever, worsening pain, or any new or worsening symptoms, he should be reevaluated immediately.  Pelvic Inflammatory Disease Pelvic inflammatory disease (PID) refers to an infection in some or all of the female organs. The infection can be in the uterus, ovaries, fallopian tubes, or the surrounding tissues in the pelvis. PID can cause abdominal or pelvic pain that comes on suddenly (acute pelvic pain). PID is a serious infection because it can lead to lasting (chronic) pelvic pain or the inability to have children (infertile).  CAUSES  The infection is often caused by the normal bacteria found in the vaginal tissues. PID may also be caused by an infection that is spread during sexual contact. PID can also occur following:   The birth of a baby.   A miscarriage.   An abortion.   Major pelvic surgery.   The use of an intrauterine device (IUD).   A sexual assault.  RISK FACTORS Certain factors can put a person at higher risk for PID, such as:  Being younger than 25 years.  Being sexually active at [place] age.  Usingnonbarrier contraception.  Havingmultiple sexual partners.  Having sex with someone who has symptoms of a genital infection.  Using oral contraception. Other times, certain behaviors can increase the possibility of getting PID, such as:  Having sex during your period.  Using a vaginal douche.  Having an intrauterine device (IUD) in place. SYMPTOMS   Abdominal or pelvic pain.   Fever.   Chills.   Abnormal vaginal discharge.  Abnormal uterine bleeding.   Unusual pain shortly after finishing your period. DIAGNOSIS  Your caregiver will choose some of the following methods to make a diagnosis, such as:     Performinga physical exam and history. A pelvic exam typically reveals a very tender uterus and surrounding pelvis.   Ordering laboratory tests including a pregnancy test, blood tests, and urine test.  Orderingcultures of the vagina and cervix to check for a sexually transmitted infection (STI).  Performing an ultrasound.   Performing a laparoscopic procedure to look inside the pelvis.  TREATMENT   Antibiotic medicines may be prescribed and taken by mouth.   Sexual partners may be treated when the infection is caused by a sexually transmitted disease (STD).   Hospitalization may be needed to give antibiotics intravenously.  Surgery may be needed, but this is rare. It may take weeks until you are completely well. If you are diagnosed with PID, you should also be checked for human immunodeficiency virus (HIV). HOME CARE INSTRUCTIONS   If given, take your antibiotics as directed. Finish the medicine even if you start to feel better.   Only take over-the-counter or prescription medicines for pain, discomfort, or fever as directed by your caregiver.   Do not have sexual intercourse until treatment is completed or as directed by your caregiver. If PID is confirmed, your recent sexual partner(s) will need treatment.   Keep your follow-up appointments. SEEK MEDICAL CARE IF:   You have increased or abnormal vaginal discharge.   You need prescription medicine for your pain.   You vomit.   You cannot take your medicines.   Your partner has an STD.  SEEK IMMEDIATE MEDICAL CARE IF:   You have a fever.   You have  increased abdominal or pelvic pain.   You have chills.   You have pain when you urinate.   You are not better after 72 hours following treatment.  MAKE SURE YOU:   Understand these instructions.  Will watch your condition.  Will get help right away if you are not doing well or get worse. Document Released: 10/29/2005 Document Revised:  02/23/2013 Document Reviewed: 10/25/2011 [facility] Patient Information 2015 [place], [place]. This information is not intended to replace advice given to you by your health care provider. Make sure you discuss any questions you have with your health care provider.

## 2015-02-11 LAB — RPR: RPR: NONREACTIVE

## 2015-02-11 LAB — GC/CHLAMYDIA PROBE AMP (~~LOC~~) NOT AT ARMC
CHLAMYDIA, DNA PROBE: POSITIVE — AB
NEISSERIA GONORRHEA: POSITIVE — AB

## 2015-02-11 LAB — HIV ANTIBODY (ROUTINE TESTING W REFLEX): HIV Screen 4th Generation wRfx: NONREACTIVE

## 2015-02-14 ENCOUNTER — Telehealth (HOSPITAL_COMMUNITY): Payer: Self-pay

## 2015-02-14 NOTE — ED Notes (Signed)
Positive for gonorrhea and chlamydia- treated per protocol. Attempted call x 1

## 2015-02-21 LAB — URINE CULTURE
Colony Count: NO GROWTH
Culture: NO GROWTH

## 2016-03-01 IMAGING — MR MR PELVIS WO/W CM
9 series · 48 of 48 positions shown · IV contrast (multihance)
Comparison: Ultrasound on 02/10/2015

CLINICAL DATA: Pelvic pain in female for 3 days. Pelvic
inflammatory disease. Bilateral adnexal mass seen on recent
ultrasound.

EXAM:
MRI PELVIS WITHOUT AND WITH CONTRAST
TECHNIQUE: Multiplanar multisequence MR imaging of the pelvis was performed
both before and after administration of intravenous contrast.
CONTRAST:  16mL MULTIHANCE GADOBENATE DIMEGLUMINE 529 MG/ML IV SOLN

[Series 2: t2_tse_sag_p2 · sagittal · 5.0mm · 0.66mm/px · 4 of 30 slices shown (1 of 2)]
[im 1/30]
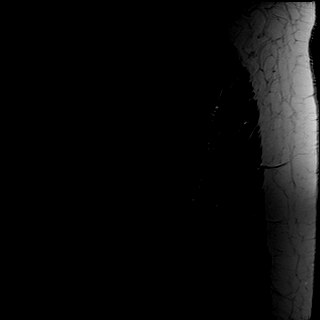
[im 10/30]
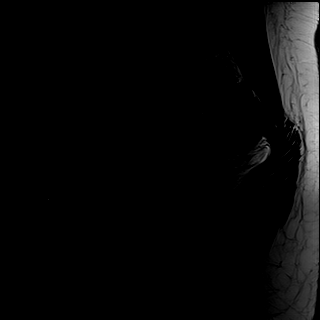
[im 20/30]
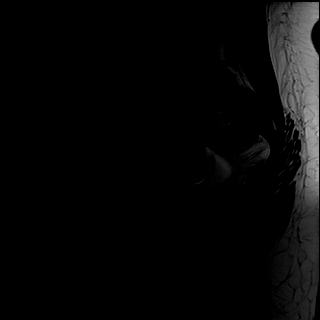
[im 30/30]
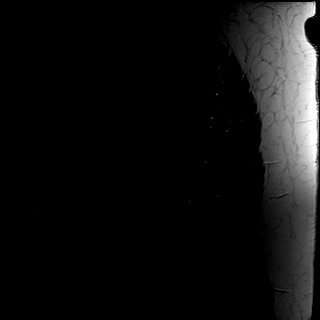

[Series 3: t2_tse_tra_p2 · axial · 5.0mm · 0.66mm/px · z∈[-142,+62]mm · 5 of 35 slices shown]
[im 1/35]
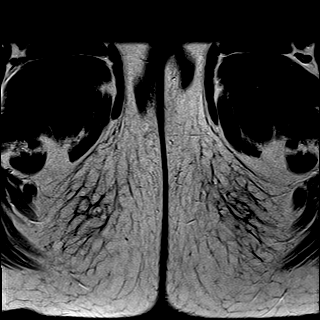
[im 9/35]
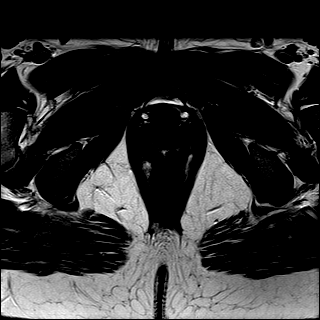
[im 18/35]
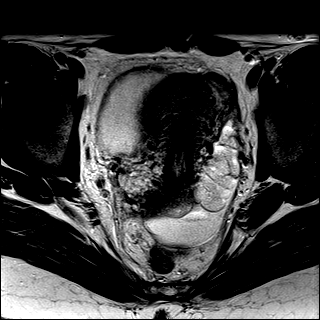
[im 26/35]
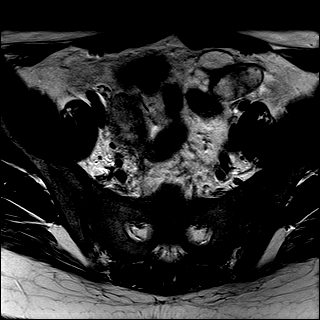
[im 35/35]
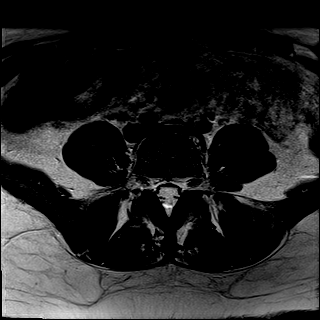

[Series 4: t2_tse fat sat_tra_p2 · axial · 5.0mm · 0.62mm/px · z∈[-142,+62]mm · 6 of 35 slices shown]
[im 1/35]
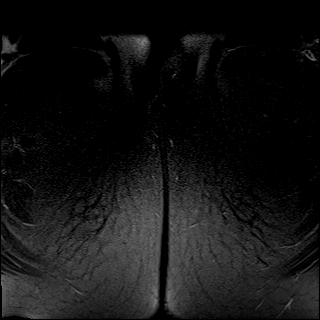
[im 7/35]
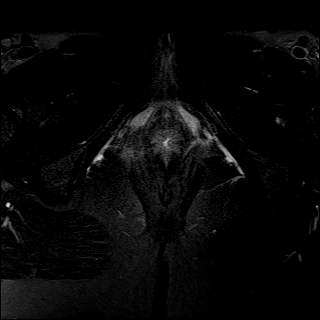
[im 14/35]
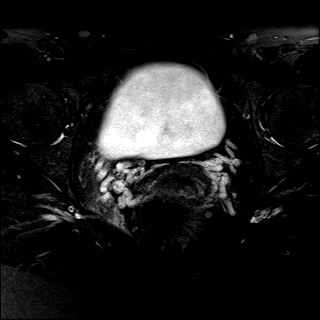
[im 21/35]
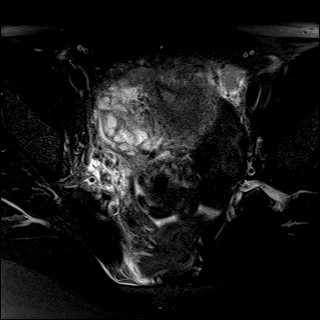
[im 28/35]
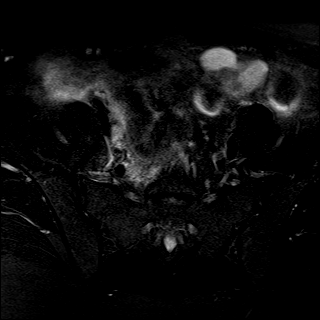
[im 35/35]
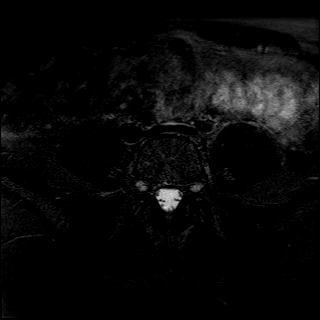

[Series 5: t2_tse_cor_p2 · coronal · 5.0mm · 0.69mm/px · 5 of 29 slices shown]
[im 1/29]
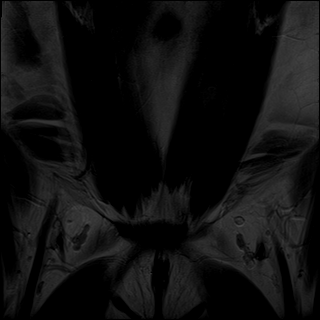
[im 8/29]
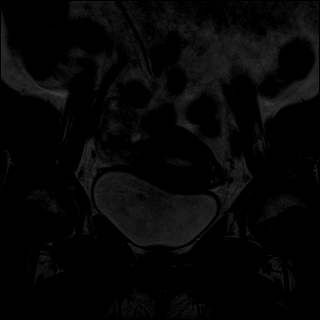
[im 15/29]
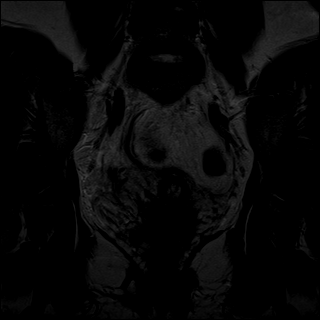
[im 22/29]
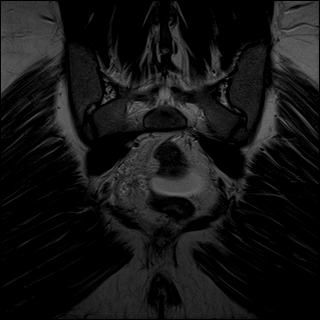
[im 29/29]
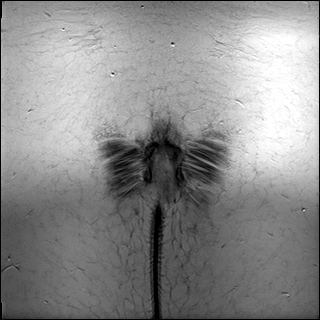

[Series 6: t1_tse_fat sat ax_p2 · axial · 5.0mm · 0.82mm/px · z∈[-142,+62]mm · 6 of 35 slices shown (1 of 2)]
[im 1/35]
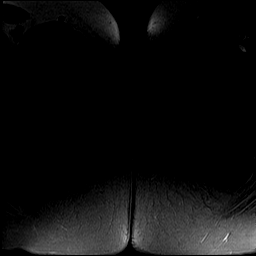
[im 7/35]
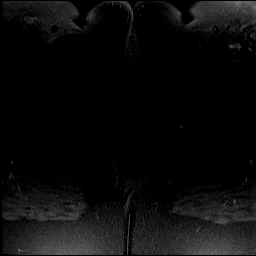
[im 14/35]
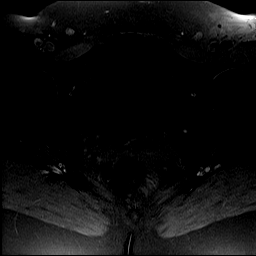
[im 21/35]
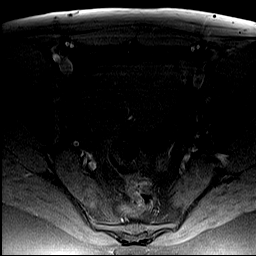
[im 28/35]
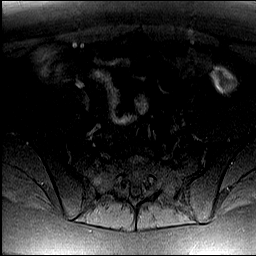
[im 35/35]
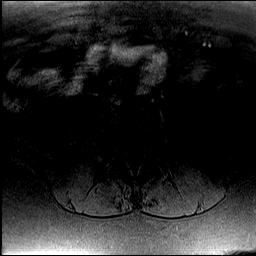

[Series 7: t1_tse ax_p2 · axial · 5.0mm · 0.82mm/px · z∈[-142,+62]mm · 6 of 35 slices shown]
[im 1/35]
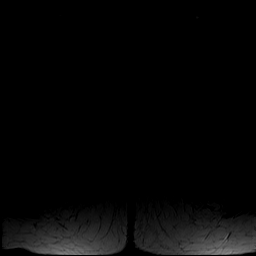
[im 7/35]
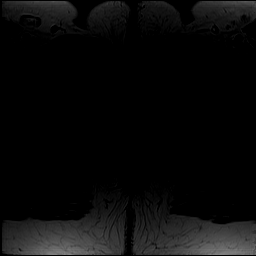
[im 14/35]
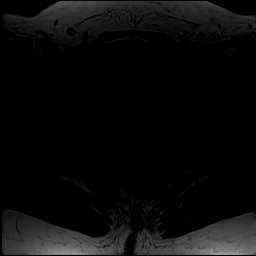
[im 21/35]
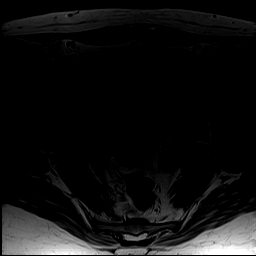
[im 28/35]
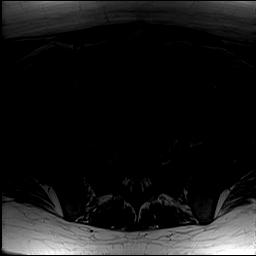
[im 35/35]
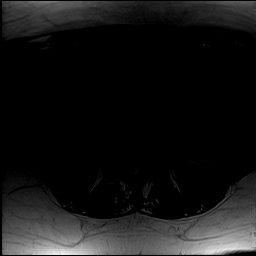

[Series 8: t1_tse_fat sat cor_p2 · coronal · 5.0mm · 0.88mm/px · 5 of 29 slices shown]
[im 1/29]
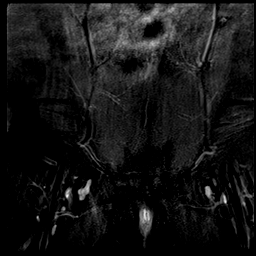
[im 8/29]
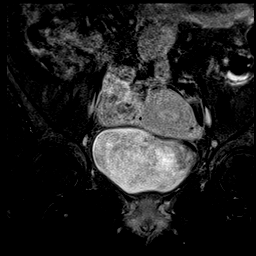
[im 15/29]
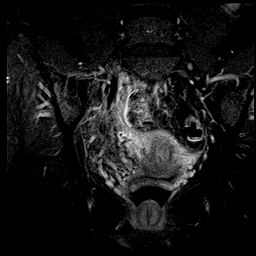
[im 22/29]
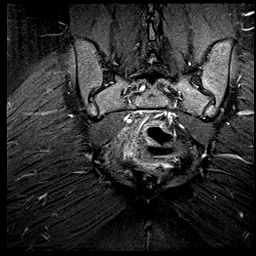
[im 29/29]
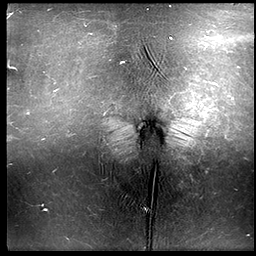

[Series 9: t1_tse_fat sat ax_p2 · axial · 5.0mm · 0.70mm/px · z∈[-142,+62]mm · 6 of 35 slices shown (2 of 2)]
[im 1/35]
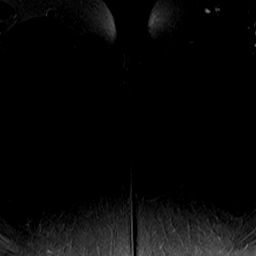
[im 7/35]
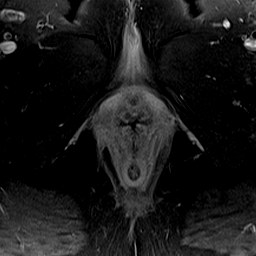
[im 14/35]
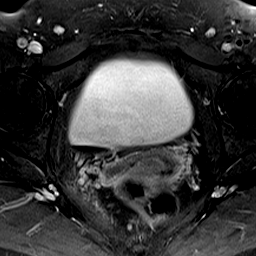
[im 21/35]
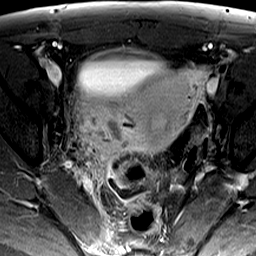
[im 28/35]
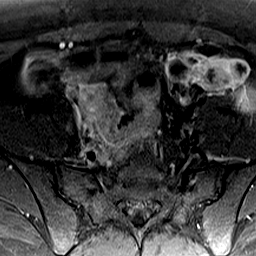
[im 35/35]
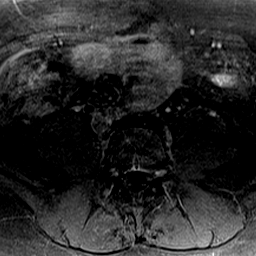

[Series 11: t2_tse_sag_p2 · sagittal · 5.0mm · 0.66mm/px · 5 of 30 slices shown (2 of 2)]
[im 1/30]
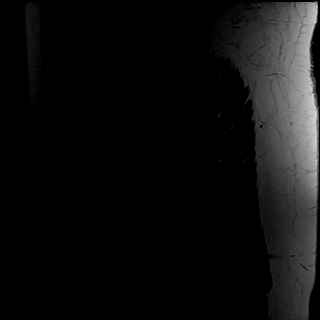
[im 8/30]
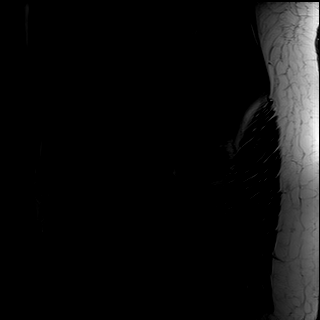
[im 15/30]
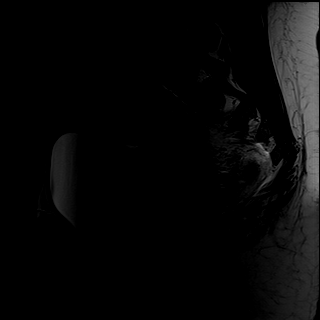
[im 22/30]
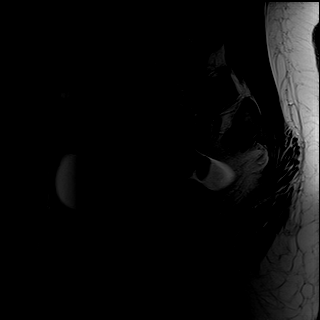
[im 30/30]
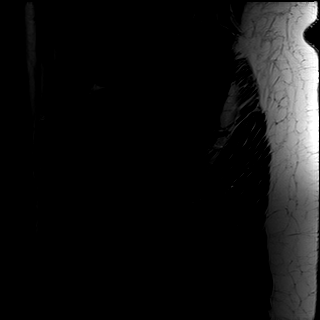

[48 of 48 positions shown; findings below may reference images not displayed]

FINDINGS: Uterus: Measures 9.4 cm in length. No fibroids or other masses
identified.

Endometrium:  Appears thin and unremarkable.

Cervix:  Appears normal.

Right ovary: Appears enlarged, measuring approximately 4.5 x 3.3 cm.
Heterogeneous enhancement with irregular central T1 hypo intense
cystic area noted which measures approximately 2.8 by 1.7 cm.
Adjacent soft tissue stranding and enhancement seen in the right
adnexa. No hemorrhagic signal intensity identified. This is
nonspecific but suspicious for a small tubo-ovarian abscess.

Left ovary: Appears enlarged are measuring at least 3.8 x 3.5 cm. A
ovoid tubular complex cystic lesion is seen just lateral to the left
ovary. This demonstrates T1 hypointensity and wall enhancement and
adjacent inflammatory changes. No hemorrhagic signal intensity
identified. This is suspicious for a tubo-ovarian abscess.

Urinary Bladder:  Unremarkable.

Bowel: Visualized pelvic loops are unremarkable.

Other: Small amount of free fluid is seen in the pelvis with diffuse
peritoneal enhancement, also suspicious for pelvic inflammatory
disease.
IMPRESSION: Bilateral adnexal complex cystic lesions, as described above, with
small amount of free fluid and peritoneal enhancement. These
findings are suspicious for pelvic inflammatory disease with
bilateral tubo-ovarian abscesses. Recommend clinical correlation,
and post treatment follow-up by ultrasound or MRI to confirm
resolution.

Normal appearance of uterus.

## 2016-05-20 ENCOUNTER — Encounter (HOSPITAL_COMMUNITY): Payer: Self-pay | Admitting: Emergency Medicine

## 2016-05-20 ENCOUNTER — Emergency Department (HOSPITAL_COMMUNITY)
Admission: EM | Admit: 2016-05-20 | Discharge: 2016-05-20 | Disposition: A | Discharge status: Home / Self Care | Payer: Medicaid Other | Attending: Emergency Medicine | Admitting: Emergency Medicine

## 2016-05-20 DIAGNOSIS — F17210 Nicotine dependence, cigarettes, uncomplicated: Secondary | ICD-10-CM | POA: Insufficient documentation

## 2016-05-20 DIAGNOSIS — R51 Headache: Secondary | ICD-10-CM | POA: Insufficient documentation

## 2016-05-20 DIAGNOSIS — H109 Unspecified conjunctivitis: Secondary | ICD-10-CM | POA: Diagnosis not present

## 2016-05-20 DIAGNOSIS — H5713 Ocular pain, bilateral: Secondary | ICD-10-CM | POA: Diagnosis present

## 2016-05-20 MED ORDER — IBUPROFEN 600 MG PO TABS: 600.0000 mg | ORAL_TABLET | Freq: Four times a day (QID) | ORAL | Status: DC | PRN

## 2016-05-20 MED ORDER — HYDROCODONE-ACETAMINOPHEN 5-325 MG PO TABS: 1.0000 | ORAL_TABLET | ORAL | Status: DC | PRN

## 2016-05-20 MED ORDER — HYDROCODONE-ACETAMINOPHEN 5-325 MG PO TABS
2.0000 | ORAL_TABLET | Freq: Once | ORAL | Status: AC
  Administered 2016-05-20: 2 via ORAL
  Filled 2016-05-20: qty 2

## 2016-05-20 MED ORDER — TOBRAMYCIN 0.3 % OP SOLN
2.0000 [drp] | Freq: Once | OPHTHALMIC | Status: AC
  Administered 2016-05-20: 2 [drp] via OPHTHALMIC
  Filled 2016-05-20: qty 5

## 2016-05-20 MED ORDER — PROMETHAZINE HCL 12.5 MG PO TABS
12.5000 mg | ORAL_TABLET | Freq: Once | ORAL | Status: AC
  Administered 2016-05-20: 12.5 mg via ORAL
  Filled 2016-05-20: qty 1

## 2016-05-20 NOTE — ED Provider Notes (Signed)
CSN: [id number]     Arrival date & time 05/20/16  1447 History  By signing my name below, I, Lisa Farrell, attest that this documentation has been prepared under the direction and in the presence of Lisa Quale, PA-C.  Electronically Signed: Tanda Farrell, ED Scribe. 05/20/2016. 5:13 PM.   Chief Complaint  Patient presents with  . Eye Pain   Patient is a 20 y.o. female presenting with eye pain. The history is provided by the patient. No language interpreter was used.  Eye Pain This is a new problem. The current episode started more than 1 week ago. The problem occurs rarely. The problem has not changed since onset.Associated symptoms include headaches. She has tried nothing for the symptoms. The treatment provided no relief.    HPI Comments: Lisa Farrell is a 20 y.o. female who presents to the Emergency Department complaining of gradual onset, constant, bilateral eye pain x 12 days. Pt also complains of redness and drainage to the eyes. She has been applying OTC eye drops without relief. Pt also complains of intermittent headaches. No recent sick contact with similar symptoms or recent injury to the eyes. Denies fever, chills, or any other associated symptoms.    Past Medical History  Diagnosis Date  . History of chlamydia   . Patient desires pregnancy 01/27/2014  . Vaginal discharge 01/27/2014  . BV (bacterial vaginosis) 05/21/2014   History reviewed. No pertinent past surgical history. Family History  Problem Relation Age of Onset  . Diabetes Father   . Bipolar disorder Brother    Social History  Substance Use Topics  . Smoking status: Current Every Day Smoker -- 1.00 packs/day for 8 years    Types: Cigarettes  . Smokeless tobacco: Never Used  . Alcohol Use: No   OB History    Gravida Para Term Preterm AB TAB SAB Ectopic Multiple Living   Review of Systems  Constitutional: Negative for fever and chills.  Eyes: Positive for pain, discharge and  redness.  Neurological: Positive for headaches.  All other systems reviewed and are negative.  Allergies  Review of patient's allergies indicates no known allergies.  Home Medications   Prior to Admission medications   Medication Sig Start Date End Date Taking? Authorizing Provider  Alum & Mag Hydroxide-Simeth (MAGIC MOUTHWASH W/LIDOCAINE) SOLN Take 5 mLs by mouth 3 (three) times daily as needed for mouth pain. Swish and spit, do not swallow 10/12/14   Tammy Triplett, PA-C  amoxicillin (AMOXIL) 500 MG capsule Take 1 capsule (500 mg total) by mouth 3 (three) times daily. For 10 days Patient not taking: Reported on 02/10/2015 10/12/14   Tammy Triplett, PA-C  doxycycline (VIBRAMYCIN) 100 MG capsule Take 1 capsule (100 mg total) by mouth 2 (two) times daily. 02/10/15   Shon Baton, MD  Ibuprofen (MIDOL PO) Take 2 tablets by mouth daily as needed (stomach pain).    Historical Provider, MD  oxyCODONE-acetaminophen (PERCOCET/ROXICET) 5-325 MG per tablet Take 1-2 tablets by mouth every 6 (six) hours as needed for severe pain. 02/10/15   Shon Baton, MD   BP 130/67 mmHg  Pulse 67  Temp(Src) 98.2 F (36.8 C) (Oral)  Resp 17  Ht 5' (1.524 m)  Wt 187 lb (84.823 kg)  BMI 36.52 kg/m2  SpO2 98%  LMP 05/06/2016   Physical Exam  Constitutional: She is oriented to person, place, and time. She appears well-developed and well-nourished. No  distress.  HENT:  Head: Normocephalic and atraumatic.  Eyes: EOM are normal.  The conjunctiva are injected bilaterally L > R.  There are 2 areas of subconjunctival hemorrhage on the left.  The anterior chamber is clear bilaterally.  EOMs are intact.  There is no increased warmth of the orbits.  Tenderness to palpation of the left globe.   Neck: Neck supple. No tracheal deviation present.  No cervical lymphadenopathy  Cardiovascular: Normal rate, regular rhythm and normal heart sounds.   Pulmonary/Chest: Effort normal and breath sounds normal. No  respiratory distress. She has no wheezes. She has no rales.  Musculoskeletal: Normal range of motion.  Lymphadenopathy:    She has no cervical adenopathy.  Neurological: She is alert and oriented to person, place, and time.  Skin: Skin is warm and dry.  Psychiatric: She has a normal mood and affect. Her behavior is normal.  Nursing note and vitals reviewed.   ED Course  Procedures (including critical care time)  DIAGNOSTIC STUDIES: Oxygen Saturation is 98% on RA, normal by my interpretation.    COORDINATION OF CARE: 5:12 PM-Discussed treatment plan which includes Rx Tobramycin and cool compresses to the eyes with pt at bedside and pt agreed to plan.   Labs Review Labs Reviewed - No data to display  Imaging Review No results found. I have personally reviewed and evaluated these images and lab results as part of my medical decision-making.   EKG Interpretation None      MDM  Exam favors bilat conjunctivitis. No noted visual changes. No evidence of orbital infection or emergent finding.Pt to be treated with ibuprofen and norco.   Final diagnoses:  Bilateral conjunctivitis    **I have reviewed nursing notes, vital signs, and all appropriate lab and imaging results for this patient.* I personally performed the services described in this documentation, which was scribed in my presence. The recorded information has been reviewed and is accurate.    Lisa QualeHobson Ramces Shomaker, PA-C 05/22/16 1206  Pricilla LovelessScott Goldston, MD 05/25/16 [id number]

## 2016-05-20 NOTE — ED Notes (Signed)
Patient c/o bilateral eye redness, pain, and drainage x5 days. Per patient clear drainage with "white crusting that matts eyes together."

## 2016-05-20 NOTE — Discharge Instructions (Signed)
Please apply cool compresses to your eyes four times daily. Use dark glasses an a hat with a brim to prevent direct light to the eyes. Use 2 drops of tobramycin eye drops every 4 hours for the next 5 days. Use ibuprofen every 6 hours. Use norco for more severe pain. This medication may cause drowsiness. Please do not drink, drive, or participate in activity that requires concentration while taking this medication. It is important that you wash your hands frequently. Wipe off surfaces you touch or around frequently. This condition is highly contagious. How to Use Eye Drops and Eye Ointments HOW TO APPLY EYE DROPS Follow these steps when applying eye drops: 1. Wash your hands. 2. Tilt your head back. 3. Put a finger under your eye and use it to gently pull your lower lid downward. Keep that finger in place. 4. Using your other hand, hold the dropper between your thumb and index finger. 5. Position the dropper just over the edge of the lower lid. Hold it as close to your eye as you can without touching the dropper to your eye. 6. Steady your hand. One way to do this is to lean your index finger against your brow. 7. Look up. 8. Slowly and gently squeeze one drop of medicine into your eye. 9. Close your eye. 10. Place a finger between your lower eyelid and your nose. Press gently for 2 minutes. This increases the amount of time that the medicine is exposed to the eye. It also reduces side effects that can develop if the drop gets into the bloodstream through the nose. HOW TO APPLY EYE OINTMENTS Follow these steps when applying eye ointments: 1. Wash your hands. 2. Put a finger under your eye and use it to gently pull your lower lid downward. Keep that finger in place. 3. Using your other hand, place the tip of the tube between your thumb and index finger with the remaining fingers braced against your cheek or nose. 4. Hold the tube just over the edge of your lower lid without touching the tube to your  lid or eyeball. 5. Look up. 6. Line the inner part of your lower lid with ointment. 7. Gently pull up on your upper lid and look down. This will force the ointment to spread over the surface of the eye. 8. Release the upper lid. 9. If you can, close your eyes for 1-2 minutes. Do not rub your eyes. If you applied the ointment correctly, your vision will be blurry for a few minutes. This is normal. ADDITIONAL INFORMATION  Make sure to use the eye drops or ointment as told by your health care provider.  If you have been told to use both eye drops and an eye ointment, apply the eye drops first, then wait 3-4 minutes before you apply the ointment.  Try not to touch the tip of the dropper or tube to your eye. A dropper or tube that has touched the eye can become contaminated.   This information is not intended to replace advice given to you by your health care provider. Make sure you discuss any questions you have with your health care provider.   Document Released: 02/04/2001 Document Revised: 03/15/2015 Document Reviewed: 10/25/2014 [employer] Interactive Patient Education [employer]

## 2017-09-29 ENCOUNTER — Other Ambulatory Visit: Payer: Self-pay

## 2017-09-29 ENCOUNTER — Emergency Department (HOSPITAL_COMMUNITY)
Admission: EM | Admit: 2017-09-29 | Discharge: 2017-09-29 | Disposition: A | Discharge status: Home / Self Care | Payer: Self-pay | Attending: Emergency Medicine | Admitting: Emergency Medicine

## 2017-09-29 DIAGNOSIS — L02211 Cutaneous abscess of abdominal wall: Secondary | ICD-10-CM | POA: Insufficient documentation

## 2017-09-29 DIAGNOSIS — L0291 Cutaneous abscess, unspecified: Secondary | ICD-10-CM

## 2017-09-29 DIAGNOSIS — Z79899 Other long term (current) drug therapy: Secondary | ICD-10-CM | POA: Insufficient documentation

## 2017-09-29 DIAGNOSIS — F17210 Nicotine dependence, cigarettes, uncomplicated: Secondary | ICD-10-CM | POA: Insufficient documentation

## 2017-09-29 MED ORDER — TRAMADOL HCL 50 MG PO TABS: 50.0000 mg | ORAL_TABLET | Freq: Four times a day (QID) | ORAL | 0 refills | Status: DC | PRN

## 2017-09-29 MED ORDER — TRAMADOL HCL 50 MG PO TABS
100.0000 mg | ORAL_TABLET | Freq: Once | ORAL | Status: AC
  Administered 2017-09-29: 100 mg via ORAL
  Filled 2017-09-29: qty 2

## 2017-09-29 MED ORDER — IBUPROFEN 800 MG PO TABS
800.0000 mg | ORAL_TABLET | Freq: Once | ORAL | Status: AC
  Administered 2017-09-29: 800 mg via ORAL
  Filled 2017-09-29: qty 1

## 2017-09-29 MED ORDER — PROMETHAZINE HCL 12.5 MG PO TABS
12.5000 mg | ORAL_TABLET | Freq: Once | ORAL | Status: AC
  Administered 2017-09-29: 12.5 mg via ORAL
  Filled 2017-09-29: qty 1

## 2017-09-29 MED ORDER — CEPHALEXIN 500 MG PO CAPS
500.0000 mg | ORAL_CAPSULE | Freq: Once | ORAL | Status: AC
  Administered 2017-09-29: 500 mg via ORAL
  Filled 2017-09-29: qty 1

## 2017-09-29 MED ORDER — CEPHALEXIN 500 MG PO CAPS: 500.0000 mg | ORAL_CAPSULE | Freq: Four times a day (QID) | ORAL | 0 refills | Status: DC

## 2017-09-29 MED ORDER — SULFAMETHOXAZOLE-TRIMETHOPRIM 800-160 MG PO TABS: 1.0000 | ORAL_TABLET | Freq: Two times a day (BID) | ORAL | 0 refills | Status: AC

## 2017-09-29 MED ORDER — SULFAMETHOXAZOLE-TRIMETHOPRIM 800-160 MG PO TABS
1.0000 | ORAL_TABLET | Freq: Once | ORAL | Status: AC
  Administered 2017-09-29: 1 via ORAL
  Filled 2017-09-29: qty 1

## 2017-09-29 NOTE — Discharge Instructions (Signed)
Please soak the abscess area and warm Epson salt water for 15 minutes daily.  Please use Keflex and 600 mg of ibuprofen with breakfast, lunch, dinner, and at bedtime.  Use Septra 2 times daily with food.  May use Ultram for more severe pain if needed.

## 2017-09-29 NOTE — ED Notes (Signed)
Pt has inflammation around the abscess on the lower abdomen. Upon palpitation, the tissue is hardened around the site. Pt reports pain at the site as constant and interfering with sleep at night. Abscess has been coming and going for apprx. 5 months. Pt reports popping the abscess last week.

## 2017-09-29 NOTE — ED Triage Notes (Signed)
Pt reports abscess to her lower abd x 2 days. Pt denies fevers or drainage. Pt reports she initially had a "boil" there a few months ago that kept going away and reoccurring.

## 2017-09-29 NOTE — ED Provider Notes (Signed)
[facility] EMERGENCY DEPARTMENT Provider Note   CSN: [id number] Arrival date & time: 09/29/17  1728     History   Chief Complaint Chief Complaint  Patient presents with  . Abscess    HPI Lisa Farrell is a 21 y.o. female.   Abscess  Abscess location: lower abdomen. Abscess quality: induration, painful and redness   Abscess quality: not draining   Red streaking: no   Duration: abscess has been coming and going for  several months. Problem worse for 2-3 days. Progression:  Worsening Pain details:    Quality:  Throbbing   Severity:  Moderate   Duration:  3 days   Timing:  Intermittent   Progression:  Worsening Chronicity:  Recurrent Context: skin injury   Context: not diabetes and not immunosuppression   Context comment:  Pt states she popped the abscess area last week. Relieved by:  Nothing Exacerbated by: movement and palpation. Ineffective treatments:  Warm compresses Associated symptoms: no fever, no nausea and no vomiting   Risk factors: prior abscess     Past Medical History:  Diagnosis Date  . BV (bacterial vaginosis) 05/21/2014  . History of chlamydia   . Patient desires pregnancy 01/27/2014  . Vaginal discharge 01/27/2014    Patient Active Problem List   Diagnosis Date Noted  . BV (bacterial vaginosis) 05/21/2014  . Patient desires pregnancy 01/27/2014  . Vaginal discharge 01/27/2014    No past surgical history on file.  OB History    Gravida Para Term Preterm AB Living   1 1   1   1    SAB TAB Ectopic Multiple Live Births                   Home Medications    Prior to Admission medications   Medication Sig Start Date End Date Taking? Authorizing Provider  Alum & Mag Hydroxide-Simeth (MAGIC MOUTHWASH W/LIDOCAINE) SOLN Take 5 mLs by mouth 3 (three) times daily as needed for mouth pain. Swish and spit, do not swallow 10/12/14   Triplett, Tammy, PA-C  amoxicillin (AMOXIL) 500 MG capsule Take 1 capsule (500 mg total) by mouth 3 (three)  times daily. For 10 days Patient not taking: Reported on 02/10/2015 10/12/14   Pauline Aus, PA-C  doxycycline (VIBRAMYCIN) 100 MG capsule Take 1 capsule (100 mg total) by mouth 2 (two) times daily. 02/10/15   Horton, Mayer Masker, MD  HYDROcodone-acetaminophen (NORCO/VICODIN) 5-325 MG tablet Take 1 tablet by mouth every 4 (four) hours as needed. 05/20/16   Ivery Quale, PA-C  ibuprofen (ADVIL,MOTRIN) 600 MG tablet Take 1 tablet (600 mg total) by mouth every 6 (six) hours as needed. 05/20/16   Ivery Quale, PA-C  oxyCODONE-acetaminophen (PERCOCET/ROXICET) 5-325 MG per tablet Take 1-2 tablets by mouth every 6 (six) hours as needed for severe pain. 02/10/15   Horton, Mayer Masker, MD    Family History Family History  Problem Relation Age of Onset  . Diabetes Father   . Bipolar disorder Brother     Social History Social History   Tobacco Use  . Smoking status: Current Every Day Smoker    Packs/day: 1.00    Years: 8.00    Pack years: 8.00    Types: Cigarettes  . Smokeless tobacco: Never Used  Substance Use Topics  . Alcohol use: No  . Drug use: No     Allergies   Zofran [ondansetron hcl]   Review of Systems Review of Systems  Constitutional: Negative for activity change and fever.  All ROS Neg except as noted in HPI  HENT: Negative for nosebleeds.   Eyes: Negative for photophobia and discharge.  Respiratory: Negative for cough, shortness of breath and wheezing.   Cardiovascular: Negative for chest pain and palpitations.  Gastrointestinal: Negative for abdominal pain, blood in stool, nausea and vomiting.  Genitourinary: Negative for dysuria, frequency and hematuria.  Musculoskeletal: Negative for arthralgias, back pain and neck pain.  Skin: Negative.   Neurological: Negative for dizziness, seizures and speech difficulty.  Psychiatric/Behavioral: Negative for confusion and hallucinations.     Physical Exam Updated Vital Signs BP 139/88 (BP Location: Right Arm)   Pulse  80   Temp 98.6 F (37 C) (Oral)   Resp 16   Ht 5\' 2"  (1.575 m)   Wt 88.5 kg (195 lb)   SpO2 98%   BMI 35.67 kg/m   Physical Exam  Constitutional: She is oriented to person, place, and time. She appears well-developed and well-nourished.  Non-toxic appearance.  HENT:  Head: Normocephalic.  Right Ear: Tympanic membrane and external ear normal.  Left Ear: Tympanic membrane and external ear normal.  Eyes: EOM and lids are normal. Pupils are equal, round, and reactive to light.  Neck: Normal range of motion. Neck supple. Carotid bruit is not present.  Cardiovascular: Normal rate, regular rhythm, normal heart sounds, intact distal pulses and normal pulses.  Pulmonary/Chest: Breath sounds normal. No respiratory distress.  Abdominal: Soft. Bowel sounds are normal. There is no tenderness. There is no guarding.    There is an abscess of the right lower abdomen.  There is no red streaks appreciated.  No active drainage noted at this time.  The area is tender to palpation.  Musculoskeletal: Normal range of motion.  Lymphadenopathy:       Head (right side): No submandibular adenopathy present.       Head (left side): No submandibular adenopathy present.    She has no cervical adenopathy.  Neurological: She is alert and oriented to person, place, and time. She has normal strength. No cranial nerve deficit or sensory deficit.  Skin: Skin is warm and dry.  Psychiatric: She has a normal mood and affect. Her speech is normal.  Nursing note and vitals reviewed.    ED Treatments / Results  Labs (all labs ordered are listed, but only abnormal results are displayed) Labs Reviewed - No data to display  EKG  EKG Interpretation None       Radiology No results found.  Procedures Procedures (including critical care time)  Medications Ordered in ED Medications - No data to display   Initial Impression / Assessment and Plan / ED Course  I have reviewed the triage vital signs and the  nursing notes.  Pertinent labs & imaging results that were available during my care of the patient were reviewed by me and considered in my medical decision making (see chart for details).       Final Clinical Impressions(s) / ED Diagnoses MDM Vital signs within normal limits.  Patient has an abscess at the left lower abdomen.  There are no red streaks appreciated.  There is no active drainage noted.  The abscess is not a candidate for incision and drainage at this time.  Patient will be treated with warm tub soaks, antibiotics, and anti-inflammatory pain medication.  I also discussed with the patient possible surgical consultation since she states that this problem will come and go away come back and return again.  Prescription was given to the patient.  Patient is in agreement with the plan at this time.   Final diagnoses:  Abscess    ED Discharge Orders        Ordered    sulfamethoxazole-trimethoprim (BACTRIM DS,SEPTRA DS) 800-160 MG tablet  2 times daily     09/29/17 1918    cephALEXin (KEFLEX) 500 MG capsule  4 times daily     09/29/17 1918    traMADol (ULTRAM) 50 MG tablet  Every 6 hours PRN     09/29/17 1918       Ivery QualeBryant, Jerricka Carvey, PA-C 09/29/17 2038    Benjiman CorePickering, Nathan, MD 09/29/17 [id number]

## 2017-12-22 ENCOUNTER — Emergency Department (HOSPITAL_COMMUNITY)
Admission: EM | Admit: 2017-12-22 | Discharge: 2017-12-22 | Disposition: A | Discharge status: Home / Self Care | Payer: Self-pay | Attending: Emergency Medicine | Admitting: Emergency Medicine

## 2017-12-22 ENCOUNTER — Emergency Department (HOSPITAL_COMMUNITY): Payer: Self-pay

## 2017-12-22 ENCOUNTER — Encounter (HOSPITAL_COMMUNITY): Payer: Self-pay | Admitting: Emergency Medicine

## 2017-12-22 ENCOUNTER — Other Ambulatory Visit: Payer: Self-pay

## 2017-12-22 DIAGNOSIS — R110 Nausea: Secondary | ICD-10-CM | POA: Insufficient documentation

## 2017-12-22 DIAGNOSIS — F17210 Nicotine dependence, cigarettes, uncomplicated: Secondary | ICD-10-CM | POA: Insufficient documentation

## 2017-12-22 DIAGNOSIS — R1084 Generalized abdominal pain: Secondary | ICD-10-CM | POA: Insufficient documentation

## 2017-12-22 DIAGNOSIS — N83202 Unspecified ovarian cyst, left side: Secondary | ICD-10-CM | POA: Insufficient documentation

## 2017-12-22 DIAGNOSIS — N83201 Unspecified ovarian cyst, right side: Secondary | ICD-10-CM | POA: Insufficient documentation

## 2017-12-22 LAB — CBC
HCT: 43.5 % (ref 36.0–46.0)
HEMOGLOBIN: 14.3 g/dL (ref 12.0–15.0)
MCH: 32.3 pg (ref 26.0–34.0)
MCHC: 32.9 g/dL (ref 30.0–36.0)
MCV: 98.2 fL (ref 78.0–100.0)
PLATELETS: 269 10*3/uL (ref 150–400)
RBC: 4.43 MIL/uL (ref 3.87–5.11)
RDW: 13.4 % (ref 11.5–15.5)
WBC: 22.6 10*3/uL — ABNORMAL HIGH (ref 4.0–10.5)

## 2017-12-22 LAB — COMPREHENSIVE METABOLIC PANEL
ALT: 16 U/L (ref 14–54)
AST: 16 U/L (ref 15–41)
Albumin: 4.1 g/dL (ref 3.5–5.0)
Alkaline Phosphatase: 66 U/L (ref 38–126)
Anion gap: 10 (ref 5–15)
BUN: 10 mg/dL (ref 6–20)
CO2: 25 mmol/L (ref 22–32)
CREATININE: 0.72 mg/dL (ref 0.44–1.00)
Calcium: 9.3 mg/dL (ref 8.9–10.3)
Chloride: 103 mmol/L (ref 101–111)
GFR calc Af Amer: >60 mL/min (ref >60)
GFR calc non Af Amer: >60 mL/min (ref >60)
GLUCOSE: 115 mg/dL — AB (ref 65–99)
Potassium: 3.9 mmol/L (ref 3.5–5.1)
Sodium: 138 mmol/L (ref 135–145)
Total Bilirubin: 0.5 mg/dL (ref 0.3–1.2)
Total Protein: 7.7 g/dL (ref 6.5–8.1)

## 2017-12-22 LAB — URINALYSIS, ROUTINE W REFLEX MICROSCOPIC
Bacteria, UA: NONE SEEN
Bilirubin Urine: NEGATIVE
Glucose, UA: NEGATIVE mg/dL
Hgb urine dipstick: NEGATIVE
Ketones, ur: NEGATIVE mg/dL
Nitrite: NEGATIVE
Protein, ur: 30 mg/dL — AB
Specific Gravity, Urine: 1.034 — ABNORMAL HIGH (ref 1.005–1.030)
pH: 6 (ref 5.0–8.0)

## 2017-12-22 LAB — PREGNANCY, URINE: Preg Test, Ur: NEGATIVE

## 2017-12-22 LAB — LIPASE, BLOOD: Lipase: 19 U/L (ref 11–51)

## 2017-12-22 MED ORDER — KETOROLAC TROMETHAMINE 30 MG/ML IJ SOLN
15.0000 mg | Freq: Once | INTRAMUSCULAR | Status: AC
  Administered 2017-12-22: 15 mg via INTRAVENOUS
  Filled 2017-12-22: qty 1

## 2017-12-22 MED ORDER — SODIUM CHLORIDE 0.9 % IV BOLUS (SEPSIS)
1000.0000 mL | Freq: Once | INTRAVENOUS | Status: AC
  Administered 2017-12-22: 1000 mL via INTRAVENOUS

## 2017-12-22 MED ORDER — IBUPROFEN 600 MG PO TABS: 600.0000 mg | ORAL_TABLET | Freq: Three times a day (TID) | ORAL | 0 refills | Status: AC

## 2017-12-22 MED ORDER — IOPAMIDOL (ISOVUE-300) INJECTION 61%
100.0000 mL | Freq: Once | INTRAVENOUS | Status: AC | PRN
Start: 2017-12-22 — End: 2017-12-22
  Administered 2017-12-22: 100 mL via INTRAVENOUS

## 2017-12-22 MED ORDER — MORPHINE SULFATE (PF) 4 MG/ML IV SOLN
4.0000 mg | Freq: Once | INTRAVENOUS | Status: AC
  Administered 2017-12-22: 4 mg via INTRAVENOUS
  Filled 2017-12-22: qty 1

## 2017-12-22 MED ORDER — HYDROCODONE-ACETAMINOPHEN 5-325 MG PO TABS: 1.0000 | ORAL_TABLET | ORAL | 0 refills | Status: DC | PRN

## 2017-12-22 NOTE — ED Provider Notes (Signed)
[facility] EMERGENCY DEPARTMENT Provider Note   CSN: [id number] Arrival date & time: 12/22/17  4098     History   Chief Complaint Chief Complaint  Patient presents with  . Abdominal Pain    HPI Lisa Farrell is a 22 y.o. female.  HPI  Patient presents with concern of abdominal pain. Onset was with subacute onset, but since that time she has had worsening pain throughout the abdomen, with associated nausea. She has had 2 bowel movements, unremarkable, no vomiting no fever. However, the pain has been persistent, with severe discomfort with motion, palpation. She has no history of abdominal surgery, does have a history of kidney stone in the distant past. She notes that with urination and defecation she has increased pressure-like sensation, otherwise no clear alleviating or exacerbating factors. No relief with medication. No fever, chills, chest pain, dyspnea.  Last menstrual period was about 1 month ago.   Past Medical History:  Diagnosis Date  . BV (bacterial vaginosis) 05/21/2014  . History of chlamydia   . Patient desires pregnancy 01/27/2014  . Vaginal discharge 01/27/2014    Patient Active Problem List   Diagnosis Date Noted  . BV (bacterial vaginosis) 05/21/2014  . Patient desires pregnancy 01/27/2014  . Vaginal discharge 01/27/2014    History reviewed. No pertinent surgical history.  OB History    Gravida Para Term Preterm AB Living   1 1   1   1    SAB TAB Ectopic Multiple Live Births                   Home Medications    Prior to Admission medications   Medication Sig Start Date End Date Taking? Authorizing Provider  Alum & Mag Hydroxide-Simeth (MAGIC MOUTHWASH W/LIDOCAINE) SOLN Take 5 mLs by mouth 3 (three) times daily as needed for mouth pain. Swish and spit, do not swallow Patient not taking: Reported on 12/22/2017 10/12/14   Triplett, Tammy, PA-C  amoxicillin (AMOXIL) 500 MG capsule Take 1 capsule (500 mg total) by mouth 3 (three) times  daily. For 10 days Patient not taking: Reported on 02/10/2015 10/12/14   Triplett, Tammy, PA-C  cephALEXin (KEFLEX) 500 MG capsule Take 1 capsule (500 mg total) 4 (four) times daily by mouth. Patient not taking: Reported on 12/22/2017 09/29/17   Ivery Quale, PA-C  doxycycline (VIBRAMYCIN) 100 MG capsule Take 1 capsule (100 mg total) by mouth 2 (two) times daily. Patient not taking: Reported on 12/22/2017 02/10/15   Horton, Mayer Masker, MD  HYDROcodone-acetaminophen (NORCO/VICODIN) 5-325 MG tablet Take 1 tablet by mouth every 4 (four) hours as needed. Patient not taking: Reported on 12/22/2017 05/20/16   Ivery Quale, PA-C  ibuprofen (ADVIL,MOTRIN) 600 MG tablet Take 1 tablet (600 mg total) by mouth every 6 (six) hours as needed. Patient not taking: Reported on 12/22/2017 05/20/16   Ivery Quale, PA-C  oxyCODONE-acetaminophen (PERCOCET/ROXICET) 5-325 MG per tablet Take 1-2 tablets by mouth every 6 (six) hours as needed for severe pain. Patient not taking: Reported on 12/22/2017 02/10/15   Horton, Mayer Masker, MD  traMADol (ULTRAM) 50 MG tablet Take 1 tablet (50 mg total) every 6 (six) hours as needed by mouth. Patient not taking: Reported on 12/22/2017 09/29/17   Ivery Quale, PA-C    Family History Family History  Problem Relation Age of Onset  . Diabetes Father   . Bipolar disorder Brother     Social History Social History   Tobacco Use  . Smoking status: Current Every Day Smoker  Packs/day: 1.00    Years: 8.00    Pack years: 8.00    Types: Cigarettes  . Smokeless tobacco: Never Used  Substance Use Topics  . Alcohol use: No  . Drug use: No     Allergies   Zofran [ondansetron hcl]   Review of Systems Review of Systems  Constitutional:       Per HPI, otherwise negative  HENT:       Per HPI, otherwise negative  Respiratory:       Per HPI, otherwise negative  Cardiovascular:       Per HPI, otherwise negative  Gastrointestinal: Negative for vomiting.  Endocrine:        Negative aside from HPI  Genitourinary:       Neg aside from HPI   Musculoskeletal:       Per HPI, otherwise negative  Skin: Negative.   Neurological: Negative for syncope.     Physical Exam Updated Vital Signs BP (!) 144/76 (BP Location: Right Arm)   Pulse 93   Temp 98.4 F (36.9 C) (Oral)   Resp 14   Ht 5\' 3"  (1.6 m)   Wt 89.6 kg (197 lb 8 oz)   LMP 11/20/2017 Comment: neg preg  SpO2 100%   BMI 34.99 kg/m   Physical Exam  Constitutional: She is oriented to person, place, and time. She appears well-developed and well-nourished. No distress.  HENT:  Head: Normocephalic and atraumatic.  Eyes: Conjunctivae and EOM are normal.  Cardiovascular: Normal rate and regular rhythm.  Pulmonary/Chest: Effort normal and breath sounds normal. No stridor. No respiratory distress.  Abdominal: She exhibits distension. There is generalized tenderness. There is guarding.  Musculoskeletal: She exhibits no edema.  Neurological: She is alert and oriented to person, place, and time. No cranial nerve deficit.  Skin: Skin is warm and dry.  Psychiatric: She has a normal mood and affect.  Nursing note and vitals reviewed.    ED Treatments / Results  Labs (all labs ordered are listed, but only abnormal results are displayed) Labs Reviewed  COMPREHENSIVE METABOLIC PANEL - Abnormal; Notable for the following components:      Result Value   Glucose, Bld 115 (*)    All other components within normal limits  CBC - Abnormal; Notable for the following components:   WBC 22.6 (*)    All other components within normal limits  URINALYSIS, ROUTINE W REFLEX MICROSCOPIC - Abnormal; Notable for the following components:   Color, Urine AMBER (*)    APPearance HAZY (*)    Specific Gravity, Urine 1.034 (*)    Protein, ur 30 (*)    Leukocytes, UA SMALL (*)    Squamous Epithelial / LPF 6-30 (*)    All other components within normal limits  LIPASE, BLOOD  PREGNANCY, URINE    EKG  EKG  Interpretation None       Radiology US Transvaginal Non-ob  Result Date: 12/22/2017 CLINICAL DATA:  Right-sided pelvic pain. EXAM: TRANSABDOMINAL AND TRANSVAGINAL ULTRASOUND OF PELVIS DOPPLER ULTRASOUND OF OVARIES TECHNIQUE: Both transabdominal and transvaginal ultrasound examinations of the pelvis were performed. Transabdominal technique was performed for global imaging of the pelvis including uterus, ovaries, adnexal regions, and pelvic cul-de-sac. It was necessary to proceed with endovaginal exam following the transabdominal exam to visualize the endometrium and ovaries to better advantage. Color and duplex Doppler ultrasound was utilized to evaluate blood flow to the ovaries. COMPARISON:  Current abdomen pelvis CT. Prior pelvic ultrasound, 02/10/2015. FINDINGS: Uterus Measurements: 8.9 x 3.7  x 4.8 cm. No fibroids or other mass visualized. Endometrium Thickness: 6 mm. No mass or focal lesion. Sliver of fluid noted in the endometrial canal. Right ovary Measurements: 4.2 x 4.0 x 3.8 cm. Normal appearance/no adnexal mass. Left ovary Measurements: 4.1 x 3.3 x 5.0 cm. 3.3 cm ovarian cyst. Ovary otherwise unremarkable. No adnexal masses. Pulsed Doppler evaluation of both ovaries demonstrates normal low-resistance arterial and venous waveforms. Other findings No abnormal free fluid. IMPRESSION: 1. No acute findings. 2. 3.3 cm left ovarian cyst consistent with a dominant follicular cyst. This is almost certainly benign, and no specific imaging follow up is recommended according to the Society of Radiologists in Ultrasound2010 Consensus Conference Statement (D Lenis Noon et al. Management of Asymptomatic Ovarian and Other Adnexal Cysts Imaged at [place]: Society of Radiologists in Ultrasound Consensus Conference Statement 2010. Radiology 256 (Sept 2010): 943-954.). 3. No adnexal masses. No abnormal fluid collections. No evidence of ovarian torsion. Electronically Signed   By: Amie Portland M.D.   On: 12/22/2017 17:38    US Pelvis Complete  Result Date: 12/22/2017 CLINICAL DATA:  Right-sided pelvic pain. EXAM: TRANSABDOMINAL AND TRANSVAGINAL ULTRASOUND OF PELVIS DOPPLER ULTRASOUND OF OVARIES TECHNIQUE: Both transabdominal and transvaginal ultrasound examinations of the pelvis were performed. Transabdominal technique was performed for global imaging of the pelvis including uterus, ovaries, adnexal regions, and pelvic cul-de-sac. It was necessary to proceed with endovaginal exam following the transabdominal exam to visualize the endometrium and ovaries to better advantage. Color and duplex Doppler ultrasound was utilized to evaluate blood flow to the ovaries. COMPARISON:  Current abdomen pelvis CT. Prior pelvic ultrasound, 02/10/2015. FINDINGS: Uterus Measurements: 8.9 x 3.7 x 4.8 cm. No fibroids or other mass visualized. Endometrium Thickness: 6 mm. No mass or focal lesion. Sliver of fluid noted in the endometrial canal. Right ovary Measurements: 4.2 x 4.0 x 3.8 cm. Normal appearance/no adnexal mass. Left ovary Measurements: 4.1 x 3.3 x 5.0 cm. 3.3 cm ovarian cyst. Ovary otherwise unremarkable. No adnexal masses. Pulsed Doppler evaluation of both ovaries demonstrates normal low-resistance arterial and venous waveforms. Other findings No abnormal free fluid. IMPRESSION: 1. No acute findings. 2. 3.3 cm left ovarian cyst consistent with a dominant follicular cyst. This is almost certainly benign, and no specific imaging follow up is recommended according to the Society of Radiologists in Ultrasound2010 Consensus Conference Statement (D Lenis Noon et al. Management of Asymptomatic Ovarian and Other Adnexal Cysts Imaged at [place]: Society of Radiologists in Ultrasound Consensus Conference Statement 2010. Radiology 256 (Sept 2010): 943-954.). 3. No adnexal masses. No abnormal fluid collections. No evidence of ovarian torsion. Electronically Signed   By: Amie Portland M.D.   On: 12/22/2017 17:38   Ct Abdomen Pelvis W Contrast  Addendum  Date: 12/22/2017   ADDENDUM REPORT: 12/22/2017 15:48 ADDENDUM: Add to IMPRESSION: Urinary bladder wall thickening.  Suspect a degree of cystitis. Electronically Signed   By: Bretta Bang III M.D.   On: 12/22/2017 15:48   Result Date: 12/22/2017 CLINICAL DATA:  Abdominal and flank pain EXAM: CT ABDOMEN AND PELVIS WITH CONTRAST TECHNIQUE: Multidetector CT imaging of the abdomen and pelvis was performed using the standard protocol following bolus administration of intravenous contrast. CONTRAST:  ISOVUE-300 IOPAMIDOL (ISOVUE-300) INJECTION 61% COMPARISON:  Report of prior CT abdomen and pelvis May 10, 2016; images from that study cannot be retrieved. FINDINGS: Lower chest: There is no lung base edema or consolidation. There is a small hiatal hernia. Hepatobiliary: No focal liver lesions are evident. Gallbladder wall is not appreciably thickened.  There is no biliary duct dilatation. Pancreas: There is no pancreatic mass or inflammatory focus. Spleen: No splenic lesions are evident. Adrenals/Urinary Tract: Adrenals bilaterally appear normal. Kidneys bilaterally show no evident mass or hydronephrosis on either side. There is no apparent renal or ureteral calculus on either side. Urinary bladder wall is somewhat thickened. Urinary bladder is midline in location. Stomach/Bowel: There is no appreciable bowel wall or mesenteric thickening. No bowel obstruction. No free air or portal venous air. Vascular/Lymphatic: No abdominal aortic aneurysm. No vascular lesions are evident. There is no adenopathy in the abdomen or pelvis. Reproductive: Uterus is anteverted. There is a presumed dominant follicle in the left ovary measuring 3.0 x 2.8 cm. The left ovary appears somewhat irregular in contour. Fluid tracks from the left ovary into the dependent portion of the pelvis. There is associated mesenteric thickening in this area in the mid to posterior right pelvis tracking toward the midline. A lesser degree of  inflammation is noted slightly to the left of midline posteriorly. This inflammatory material abuts the anterior rectal wall but does not cause appreciable rectal wall thickening. Inflammation extends anterior to the right ovary and surrounds loops of bowel but does not cause bowel wall thickening. Other: Appendix appears normal. No abscess or free fluid evident in the abdomen or pelvis. Musculoskeletal: There is a lucent area in the intertrochanteric femur on the left measuring 3.2 x 2.9 cm. No similar lesions are noted elsewhere in the abdomen or pelvis. No intramuscular or abdominal wall lesion evident. IMPRESSION: 1. Inflammatory appearance in the right adnexal region with somewhat irregular appearing right ovary and fluid and mesenteric thickening tracking posteriorly and to a lesser extent anteriorly from the right ovary. Apparent loculated fluid is seen tracking toward the cul-de-sac on the right. Inflammatory material abuts the anterior rectum and a loop of pelvic small bowel on the right without thickening of the wall in these areas. The appearance raises concern for hemorrhagic cyst rupture on the right versus potential hydrosalpinx/pelvic inflammatory disease. Correlation with pelvic ultrasound advised in this regard. 2. No evident bowel obstruction. Apart from potential right-sided hydrosalpinx/tubo-ovarian abscess, there is no appreciable abscess in the abdomen or pelvis. Appendix appears normal. 3. Lucent lesion in the left intertrochanteric femur region. This area appears benign but does warrant a follow-up radiographic examination of the left proximal femur in 3 months to assess for stability. 4.  Small hiatal hernia. 5.  No renal or ureteral calculus.  No hydronephrosis. Electronically Signed: By: Bretta Bang III M.D. On: 12/22/2017 14:58   [place] Art/ven Flow Abd Pelv Doppler  Result Date: 12/22/2017 CLINICAL DATA:  Right-sided pelvic pain. EXAM: TRANSABDOMINAL AND TRANSVAGINAL ULTRASOUND OF  PELVIS DOPPLER ULTRASOUND OF OVARIES TECHNIQUE: Both transabdominal and transvaginal ultrasound examinations of the pelvis were performed. Transabdominal technique was performed for global imaging of the pelvis including uterus, ovaries, adnexal regions, and pelvic cul-de-sac. It was necessary to proceed with endovaginal exam following the transabdominal exam to visualize the endometrium and ovaries to better advantage. Color and duplex Doppler ultrasound was utilized to evaluate blood flow to the ovaries. COMPARISON:  Current abdomen pelvis CT. Prior pelvic ultrasound, 02/10/2015. FINDINGS: Uterus Measurements: 8.9 x 3.7 x 4.8 cm. No fibroids or other mass visualized. Endometrium Thickness: 6 mm. No mass or focal lesion. Sliver of fluid noted in the endometrial canal. Right ovary Measurements: 4.2 x 4.0 x 3.8 cm. Normal appearance/no adnexal mass. Left ovary Measurements: 4.1 x 3.3 x 5.0 cm. 3.3 cm ovarian cyst. Ovary otherwise unremarkable.  No adnexal masses. Pulsed Doppler evaluation of both ovaries demonstrates normal low-resistance arterial and venous waveforms. Other findings No abnormal free fluid. IMPRESSION: 1. No acute findings. 2. 3.3 cm left ovarian cyst consistent with a dominant follicular cyst. This is almost certainly benign, and no specific imaging follow up is recommended according to the Society of Radiologists in Ultrasound2010 Consensus Conference Statement (D Lenis NoonLevine et al. Management of Asymptomatic Ovarian and Other Adnexal Cysts Imaged at [place]: Society of Radiologists in Ultrasound Consensus Conference Statement 2010. Radiology 256 (Sept 2010): 943-954.). 3. No adnexal masses. No abnormal fluid collections. No evidence of ovarian torsion. Electronically Signed   By: Amie Portlandavid  Ormond M.D.   On: 12/22/2017 17:38    Procedures Procedures (including critical care time)  Medications Ordered in ED Medications  sodium chloride 0.9 % bolus 1,000 mL (0 mLs Intravenous Stopped 12/22/17 1503)   morphine 4 MG/ML injection 4 mg (4 mg Intravenous Given 12/22/17 1258)  ketorolac (TORADOL) 30 MG/ML injection 15 mg (15 mg Intravenous Given 12/22/17 1259)  iopamidol (ISOVUE-300) 61 % injection 100 mL (100 mLs Intravenous Contrast Given 12/22/17 1440)  morphine 4 MG/ML injection 4 mg (4 mg Intravenous Given 12/22/17 1545)     Initial Impression / Assessment and Plan / ED Course  I have reviewed the triage vital signs and the nursing notes.  Pertinent labs & imaging results that were available during my care of the patient were reviewed by me and considered in my medical decision making (see chart for details).    Update:, Patient continues to complain of pain. Initial CT notable for inflammatory changes in the right adnexa. Given the patient's substantial pain, ultrasound ordered.   6:10 PM Patient awake and alert, aware of all findings per She confirms that she has no vaginal complaints, and that her pain began suddenly yesterday Given these descriptions, as well as the ultrasound and CT findings consistent with presence of ovarian cyst on the left, and a likely hemorrhagic cyst formerly on the right, with no evidence for acute appendicitis, or other new intra-abdominal pathology, patient is appropriate for discharge with outpatient gynecology follow-up Patient does have mild leukocytosis, with a fever, no appendicitis, no evidence for TOA, and absent any vaginal complaints, there is lower suspicion for pelvic inflammatory disease or other infectious etiology.  Final Clinical Impressions(s) / ED Diagnoses   Final diagnoses:  Cysts of both ovaries  Generalized abdominal pain    ED Discharge Orders        Ordered    HYDROcodone-acetaminophen (NORCO/VICODIN) 5-325 MG tablet  Every 4 hours PRN     12/22/17 1814    ibuprofen (ADVIL,MOTRIN) 600 MG tablet  3 times daily     12/22/17 1814       Gerhard MunchLockwood, Aiken Withem, MD 12/22/17 1814

## 2017-12-22 NOTE — Discharge Instructions (Signed)
As discussed, today's evaluation has been generally reassuring, though there is demonstration of likely ruptured ovarian cyst. It is important he follow-up with your gynecologist for further monitoring and management. Return here for concerning changes in her condition.

## 2017-12-22 NOTE — ED Notes (Signed)
Pt is having bilateral flank pain. When tapping on flanks, pt states she is having intense pain.

## 2017-12-22 NOTE — ED Triage Notes (Signed)
Patient c/o upper abd pain that started yesterday and is progressively getting worse. Denies any nausea, vomiting, diarrhea, fevers, or urinary symptoms. Per patient abd pressure.

## 2017-12-22 NOTE — ED Notes (Signed)
Patient transported to Ultrasound 

## 2017-12-22 NOTE — ED Notes (Signed)
Pt in CT.

## 2018-05-10 ENCOUNTER — Encounter (HOSPITAL_COMMUNITY): Payer: Self-pay | Admitting: Emergency Medicine

## 2018-05-10 ENCOUNTER — Other Ambulatory Visit: Payer: Self-pay

## 2018-05-10 ENCOUNTER — Emergency Department (HOSPITAL_COMMUNITY)
Admission: EM | Admit: 2018-05-10 | Discharge: 2018-05-10 | Disposition: A | Discharge status: Home / Self Care | Payer: Self-pay | Attending: Emergency Medicine | Admitting: Emergency Medicine

## 2018-05-10 DIAGNOSIS — Y999 Unspecified external cause status: Secondary | ICD-10-CM | POA: Insufficient documentation

## 2018-05-10 DIAGNOSIS — Z23 Encounter for immunization: Secondary | ICD-10-CM | POA: Insufficient documentation

## 2018-05-10 DIAGNOSIS — F17210 Nicotine dependence, cigarettes, uncomplicated: Secondary | ICD-10-CM | POA: Insufficient documentation

## 2018-05-10 DIAGNOSIS — Y929 Unspecified place or not applicable: Secondary | ICD-10-CM | POA: Insufficient documentation

## 2018-05-10 DIAGNOSIS — Y939 Activity, unspecified: Secondary | ICD-10-CM | POA: Insufficient documentation

## 2018-05-10 DIAGNOSIS — S91115A Laceration without foreign body of left lesser toe(s) without damage to nail, initial encounter: Secondary | ICD-10-CM

## 2018-05-10 DIAGNOSIS — X58XXXA Exposure to other specified factors, initial encounter: Secondary | ICD-10-CM | POA: Insufficient documentation

## 2018-05-10 MED ORDER — TETANUS-DIPHTH-ACELL PERTUSSIS 5-2.5-18.5 LF-MCG/0.5 IM SUSP
0.5000 mL | Freq: Once | INTRAMUSCULAR | Status: AC
  Administered 2018-05-10: 0.5 mL via INTRAMUSCULAR
  Filled 2018-05-10: qty 0.5

## 2018-05-10 MED ORDER — LIDOCAINE HCL (PF) 2 % IJ SOLN
5.0000 mL | Freq: Once | INTRAMUSCULAR | Status: AC
  Administered 2018-05-10: 5 mL via INTRADERMAL

## 2018-05-10 MED ORDER — POVIDONE-IODINE 10 % EX SOLN
CUTANEOUS | Status: DC | PRN
  Administered 2018-05-10: 2 via TOPICAL
  Filled 2018-05-10: qty 30

## 2018-05-10 MED ORDER — LIDOCAINE HCL (PF) 2 % IJ SOLN
INTRAMUSCULAR | Status: AC
  Filled 2018-05-10: qty 20
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
## 2018-05-10 NOTE — Discharge Instructions (Addendum)
Keep the toe clean, dry and bandaged.  Wear closed toe shoes until the wound heals.  Return for any signs of infection
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
## 2019-06-02 ENCOUNTER — Telehealth: Payer: Self-pay | Admitting: Adult Health

## 2019-06-02 NOTE — Telephone Encounter (Signed)
Patient called, wants to be tested for STD's and wants to see if she's pregnant.  She has taken a home test, but it was blurry.  [phone]

## 2019-06-03 ENCOUNTER — Other Ambulatory Visit: Payer: Self-pay

## 2019-06-03 ENCOUNTER — Other Ambulatory Visit (INDEPENDENT_AMBULATORY_CARE_PROVIDER_SITE_OTHER): Payer: Self-pay

## 2019-06-03 DIAGNOSIS — N923 Ovulation bleeding: Secondary | ICD-10-CM

## 2019-06-03 DIAGNOSIS — Z3202 Encounter for pregnancy test, result negative: Secondary | ICD-10-CM

## 2019-06-03 LAB — POCT URINE PREGNANCY: Preg Test, Ur: NEGATIVE

## 2019-06-16 ENCOUNTER — Other Ambulatory Visit: Payer: Self-pay | Admitting: Adult Health

## 2019-06-16 ENCOUNTER — Telehealth: Payer: Self-pay | Admitting: Adult Health

## 2019-06-16 LAB — NUSWAB VAGINITIS PLUS (VG+)
Candida albicans, NAA: POSITIVE — AB
Candida glabrata, NAA: NEGATIVE
Chlamydia trachomatis, NAA: POSITIVE — AB
Megasphaera 1: HIGH Score — AB
Neisseria gonorrhoeae, NAA: POSITIVE — AB
Trich vag by NAA: POSITIVE — AB

## 2019-06-16 LAB — SPECIMEN STATUS REPORT

## 2019-06-16 MED ORDER — METRONIDAZOLE 500 MG PO TABS: 500.0000 mg | ORAL_TABLET | Freq: Three times a day (TID) | ORAL | 0 refills | Status: DC

## 2019-06-16 MED ORDER — AZITHROMYCIN 500 MG PO TABS: ORAL_TABLET | ORAL | 0 refills | Status: DC

## 2019-06-16 MED ORDER — FLUCONAZOLE 150 MG PO TABS: ORAL_TABLET | ORAL | 1 refills | Status: DC

## 2019-06-16 NOTE — Telephone Encounter (Signed)
Left message to call back about labs self collected in office

## 2019-06-16 NOTE — Progress Notes (Signed)
Will rx flagyl, diflucan and azithromycin, for +BV,Trich and chlamydia

## 2019-06-18 ENCOUNTER — Telehealth: Payer: Self-pay | Admitting: Adult Health

## 2019-06-18 ENCOUNTER — Telehealth: Payer: Self-pay | Admitting: *Deleted

## 2019-06-18 NOTE — Telephone Encounter (Signed)
LMOVM at both numbers for patient to return my call.

## 2019-06-18 NOTE — Telephone Encounter (Signed)
Left message with her mom to Call the office tomorrow and ask to speak to [facility]

## 2019-06-19 ENCOUNTER — Telehealth: Payer: Self-pay | Admitting: *Deleted

## 2019-06-19 ENCOUNTER — Other Ambulatory Visit: Payer: Self-pay

## 2019-06-19 ENCOUNTER — Ambulatory Visit (INDEPENDENT_AMBULATORY_CARE_PROVIDER_SITE_OTHER): Payer: Self-pay

## 2019-06-19 VITALS — Ht 64.0 in | Wt 204.4 lb

## 2019-06-19 DIAGNOSIS — A549 Gonococcal infection, unspecified: Secondary | ICD-10-CM

## 2019-06-19 MED ORDER — CEFTRIAXONE SODIUM 250 MG IJ SOLR
250.0000 mg | Freq: Once | INTRAMUSCULAR | Status: AC
  Administered 2019-06-19: 12:00:00 250 mg via INTRAMUSCULAR

## 2019-06-19 NOTE — Progress Notes (Signed)
Pt here Rocephin 250 mg injection  Rt VG. Tolerated well return 10-14 day proof of cure. + GC. Will wait 15 minutes before leaving. Pad CMA

## 2019-06-19 NOTE — Telephone Encounter (Signed)
Spoke to patient regarding swab results.  Pt very upset.  Is headed to work now but will call us back to let us know if she can come before 12 for injection.

## 2019-07-16 ENCOUNTER — Telehealth: Payer: Self-pay | Admitting: Women's Health

## 2019-07-16 NOTE — Telephone Encounter (Signed)
Called patient regarding appointment and the following message was left: ° ° °We have you scheduled for an upcoming appointment at our office. At this time, patients are encouraged to come alone to their visits whenever possible, however, a support person, over age 25, may accompany you to your appointment if assistance is needed for safety or care concerns. Otherwise, support persons should remain outside until the visit is complete.  ° °We ask if you have had any exposure to anyone suspected or confirmed of having COVID-19 or if you are experiencing any of the following, to call and reschedule your appointment: fever, cough, shortness of breath, muscle pain, diarrhea, rash, vomiting, abdominal pain, red eye, weakness, bruising, bleeding, joint pain, or a severe headache.  ° °Please know we will ask you these questions or similar questions when you arrive for your appointment and again it’s how we are keeping everyone safe.   ° °Also,to keep you safe, please use the provided hand sanitizer when you enter the office. We are asking everyone in the office to wear a mask to help prevent the spread of °germs. If you have a mask of your own, please wear it to your appointment, if not, we are happy to provide one for you. ° °Thank you for understanding and your cooperation.  ° ° °[facility] Staff ° ° ° ° ° °

## 2019-07-17 ENCOUNTER — Other Ambulatory Visit: Payer: Self-pay

## 2019-07-17 DIAGNOSIS — A549 Gonococcal infection, unspecified: Secondary | ICD-10-CM

## 2019-07-17 DIAGNOSIS — A749 Chlamydial infection, unspecified: Secondary | ICD-10-CM

## 2019-07-17 DIAGNOSIS — A599 Trichomoniasis, unspecified: Secondary | ICD-10-CM

## 2019-07-21 LAB — NUSWAB VAGINITIS PLUS (VG+)
Candida albicans, NAA: NEGATIVE
Candida glabrata, NAA: NEGATIVE
Chlamydia trachomatis, NAA: NEGATIVE
Neisseria gonorrhoeae, NAA: NEGATIVE
Trich vag by NAA: NEGATIVE

## 2020-02-11 ENCOUNTER — Encounter (HOSPITAL_COMMUNITY): Payer: Self-pay | Admitting: Emergency Medicine

## 2020-02-11 ENCOUNTER — Emergency Department (HOSPITAL_COMMUNITY)
Admission: EM | Admit: 2020-02-11 | Discharge: 2020-02-11 | Disposition: A | Discharge status: Home / Self Care | Payer: Self-pay | Attending: Emergency Medicine | Admitting: Emergency Medicine

## 2020-02-11 ENCOUNTER — Other Ambulatory Visit: Payer: Self-pay

## 2020-02-11 DIAGNOSIS — F17210 Nicotine dependence, cigarettes, uncomplicated: Secondary | ICD-10-CM | POA: Insufficient documentation

## 2020-02-11 DIAGNOSIS — R112 Nausea with vomiting, unspecified: Secondary | ICD-10-CM | POA: Insufficient documentation

## 2020-02-11 DIAGNOSIS — Z79899 Other long term (current) drug therapy: Secondary | ICD-10-CM | POA: Insufficient documentation

## 2020-02-11 DIAGNOSIS — F1210 Cannabis abuse, uncomplicated: Secondary | ICD-10-CM | POA: Insufficient documentation

## 2020-02-11 MED ORDER — ACETAMINOPHEN 325 MG PO TABS
650.0000 mg | ORAL_TABLET | Freq: Once | ORAL | Status: AC
  Administered 2020-02-11: 21:00:00 650 mg via ORAL
  Filled 2020-02-11: qty 2

## 2020-02-11 MED ORDER — PROMETHAZINE HCL 12.5 MG PO TABS
25.0000 mg | ORAL_TABLET | Freq: Once | ORAL | Status: AC
  Administered 2020-02-11: 21:00:00 25 mg via ORAL
  Filled 2020-02-11: qty 2

## 2020-02-11 MED ORDER — PROMETHAZINE HCL 25 MG PO TABS: 25.0000 mg | ORAL_TABLET | Freq: Four times a day (QID) | ORAL | 0 refills | Status: DC | PRN

## 2020-02-11 NOTE — ED Notes (Signed)
Patient is tolerating oral fluids

## 2020-02-11 NOTE — Discharge Instructions (Signed)
Return if any problems.

## 2020-02-11 NOTE — ED Triage Notes (Signed)
Patient c/o abdominal pain with emesis for the past 2 days. Patient states she has vomited 7 times today, last BM was yesterday morning and was normal. Patient last took tylenol at 1130 this morning.

## 2020-02-11 NOTE — ED Provider Notes (Signed)
[facility] EMERGENCY DEPARTMENT Provider Note   CSN: [id number] Arrival date & time: 02/11/20  9509     History Chief Complaint  Patient presents with  . Abdominal Pain    Lisa Farrell is a 24 y.o. female.  The history is provided by the patient. No language interpreter was used.  Emesis Severity:  Mild Duration:  1 day Timing:  Constant Number of daily episodes:  Multiple Quality:  Unable to specify Progression:  Worsening Chronicity:  New Relieved by:  Nothing Worsened by:  Nothing Ineffective treatments:  None tried Associated symptoms: no abdominal pain   Pt reports several of her coworkers have had a gi illness.  Pt complains of nausea and vomiting today.  Pt reports she needs a note for missing work.  Pt is on menses and states she is not pregnant.  Pt reports she is beginning to feel better.      Past Medical History:  Diagnosis Date  . BV (bacterial vaginosis) 05/21/2014  . History of chlamydia   . Patient desires pregnancy 01/27/2014  . Vaginal discharge 01/27/2014    Patient Active Problem List   Diagnosis Date Noted  . BV (bacterial vaginosis) 05/21/2014  . Patient desires pregnancy 01/27/2014  . Vaginal discharge 01/27/2014    History reviewed. No pertinent surgical history.   OB History    Gravida  1   Para  1   Term      Preterm  1   AB      Living  1     SAB      TAB      Ectopic      Multiple      Live Births              Family History  Problem Relation Age of Onset  . Diabetes Father   . Bipolar disorder Brother     Social History   Tobacco Use  . Smoking status: Current Every Day Smoker    Packs/day: 1.00    Years: 8.00    Pack years: 8.00    Types: Cigarettes  . Smokeless tobacco: Never Used  Substance Use Topics  . Alcohol use: No  . Drug use: Yes    Frequency: 7.0 times per week    Types: Marijuana    Home Medications Prior to Admission medications   Medication Sig Start Date End Date  Taking? Authorizing Provider  azithromycin (ZITHROMAX) 500 MG tablet Take 2 po now 06/16/19   Cyril Mourning A, NP  fluconazole (DIFLUCAN) 150 MG tablet Take 1 now and repeat 1 in 3 days Patient not taking: Reported on 06/19/2019 06/16/19   Adline Potter, NP  HYDROcodone-acetaminophen (NORCO/VICODIN) 5-325 MG tablet Take 1 tablet by mouth every 4 (four) hours as needed. Patient not taking: Reported on 06/19/2019 12/22/17   Gerhard Munch, MD  metroNIDAZOLE (FLAGYL) 500 MG tablet Take 1 tablet (500 mg total) by mouth 3 (three) times daily. Patient not taking: Reported on 06/19/2019 06/16/19   Adline Potter, NP  promethazine (PHENERGAN) 25 MG tablet Take 1 tablet (25 mg total) by mouth every 6 (six) hours as needed for nausea or vomiting. 02/11/20   Elson Areas, PA-C    Allergies    Zofran Frazier Richards hcl]  Review of Systems   Review of Systems  Gastrointestinal: Positive for vomiting. Negative for abdominal pain.  All other systems reviewed and are negative.   Physical Exam Updated Vital Signs BP 132/76 (BP Location:  Right Arm)   Pulse 76   Temp 98.6 F (37 C) (Oral)   Resp 17   Ht 5\' 5"  (1.651 m)   Wt 90.7 kg   LMP 02/08/2020 (Exact Date)   SpO2 100%   BMI 33.28 kg/m   Physical Exam Vitals and nursing note reviewed.  Constitutional:      Appearance: She is well-developed.  HENT:     Head: Normocephalic.     Mouth/Throat:     Mouth: Mucous membranes are moist.  Eyes:     Extraocular Movements: Extraocular movements intact.  Cardiovascular:     Rate and Rhythm: Normal rate and regular rhythm.  Pulmonary:     Effort: Pulmonary effort is normal.  Abdominal:     General: There is no distension.  Musculoskeletal:        General: Normal range of motion.     Cervical back: Normal range of motion.  Skin:    General: Skin is warm.  Neurological:     General: No focal deficit present.     Mental Status: She is alert and oriented to person, place, and time.    Psychiatric:        Mood and Affect: Mood normal.     ED Results / Procedures / Treatments   Labs (all labs ordered are listed, but only abnormal results are displayed) Labs Reviewed - No data to display  EKG None  Radiology No results found.  Procedures Procedures (including critical care time)  Medications Ordered in ED Medications  promethazine (PHENERGAN) tablet 25 mg (25 mg Oral Given 02/11/20 2030)  acetaminophen (TYLENOL) tablet 650 mg (650 mg Oral Given 02/11/20 2030)    ED Course  I have reviewed the triage vital signs and the nursing notes.  Pertinent labs & imaging results that were available during my care of the patient were reviewed by me and considered in my medical decision making (see chart for details).    MDM Rules/Calculators/A&P                      Pt given tylenol and phenergan.  Pt reports she feels better.  Pt taking oral fluids  Final Clinical Impression(s) / ED Diagnoses Final diagnoses:  Nausea and vomiting, intractability of vomiting not specified, unspecified vomiting type    Rx / DC Orders ED Discharge Orders         Ordered    promethazine (PHENERGAN) 25 MG tablet  Every 6 hours PRN     02/11/20 2110        An After Visit Summary was printed and given to the patient.    Sidney Ace 02/11/20 2123    Milton Ferguson, MD 02/12/20 1034

## 2020-07-06 ENCOUNTER — Other Ambulatory Visit: Payer: Self-pay | Admitting: Advanced Practice Midwife

## 2021-01-13 ENCOUNTER — Other Ambulatory Visit: Payer: Self-pay

## 2021-01-13 ENCOUNTER — Emergency Department (HOSPITAL_COMMUNITY)
Admission: EM | Admit: 2021-01-13 | Discharge: 2021-01-15 | Disposition: A | Discharge status: Home / Self Care | Payer: Self-pay | Attending: Emergency Medicine | Admitting: Emergency Medicine

## 2021-01-13 ENCOUNTER — Encounter (HOSPITAL_COMMUNITY): Payer: Self-pay | Admitting: Emergency Medicine

## 2021-01-13 ENCOUNTER — Emergency Department (HOSPITAL_COMMUNITY): Payer: Self-pay

## 2021-01-13 DIAGNOSIS — Z20822 Contact with and (suspected) exposure to covid-19: Secondary | ICD-10-CM | POA: Insufficient documentation

## 2021-01-13 DIAGNOSIS — F17210 Nicotine dependence, cigarettes, uncomplicated: Secondary | ICD-10-CM | POA: Insufficient documentation

## 2021-01-13 DIAGNOSIS — T401X4A Poisoning by heroin, undetermined, initial encounter: Secondary | ICD-10-CM

## 2021-01-13 DIAGNOSIS — Y92009 Unspecified place in unspecified non-institutional (private) residence as the place of occurrence of the external cause: Secondary | ICD-10-CM | POA: Insufficient documentation

## 2021-01-13 DIAGNOSIS — F1110 Opioid abuse, uncomplicated: Secondary | ICD-10-CM | POA: Insufficient documentation

## 2021-01-13 DIAGNOSIS — F332 Major depressive disorder, recurrent severe without psychotic features: Secondary | ICD-10-CM | POA: Diagnosis present

## 2021-01-13 DIAGNOSIS — S0990XA Unspecified injury of head, initial encounter: Secondary | ICD-10-CM | POA: Insufficient documentation

## 2021-01-13 DIAGNOSIS — Z046 Encounter for general psychiatric examination, requested by authority: Secondary | ICD-10-CM | POA: Insufficient documentation

## 2021-01-13 NOTE — ED Notes (Signed)
Patient 02 dropped to 8, MD at beside at this time. Patient placed on cardiac monitoring and EKG done . EKG done and seen by Dr Judd Lien
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
## 2021-01-13 NOTE — ED Notes (Signed)
[place] [employer] to department and stated they were called out because pt was unresponsive at home and her lips were turning blue. Per family, pt recently used heroin. MD @ bedside.
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
## 2021-01-14 DIAGNOSIS — F332 Major depressive disorder, recurrent severe without psychotic features: Secondary | ICD-10-CM

## 2021-01-14 LAB — COMPREHENSIVE METABOLIC PANEL
ALT: 38 U/L (ref 0–44)
AST: 24 U/L (ref 15–41)
Albumin: 4 g/dL (ref 3.5–5.0)
Alkaline Phosphatase: 60 U/L (ref 38–126)
Anion gap: 10 (ref 5–15)
BUN: 8 mg/dL (ref 6–20)
CO2: 25 mmol/L (ref 22–32)
Calcium: 8.7 mg/dL — ABNORMAL LOW (ref 8.9–10.3)
Chloride: 101 mmol/L (ref 98–111)
Creatinine, Ser: 0.89 mg/dL (ref 0.44–1.00)
GFR, Estimated: >60 mL/min (ref >60)
Glucose, Bld: 101 mg/dL — ABNORMAL HIGH (ref 70–99)
Potassium: 3.5 mmol/L (ref 3.5–5.1)
Sodium: 136 mmol/L (ref 135–145)
Total Bilirubin: 0.5 mg/dL (ref 0.3–1.2)
Total Protein: 7.6 g/dL (ref 6.5–8.1)

## 2021-01-14 LAB — SALICYLATE LEVEL: Salicylate Lvl: <7 mg/dL — ABNORMAL LOW (ref 7.0–30.0)

## 2021-01-14 LAB — CBC WITH DIFFERENTIAL/PLATELET
Abs Immature Granulocytes: 0.05 10*3/uL (ref 0.00–0.07)
Basophils Absolute: 0.1 10*3/uL (ref 0.0–0.1)
Basophils Relative: 1 %
Eosinophils Absolute: 0.2 10*3/uL (ref 0.0–0.5)
Eosinophils Relative: 1 %
HCT: 43.7 % (ref 36.0–46.0)
Hemoglobin: 14.6 g/dL (ref 12.0–15.0)
Immature Granulocytes: 0 %
Lymphocytes Relative: 26 %
Lymphs Abs: 3.6 10*3/uL (ref 0.7–4.0)
MCH: 33 pg (ref 26.0–34.0)
MCHC: 33.4 g/dL (ref 30.0–36.0)
MCV: 98.9 fL (ref 80.0–100.0)
Monocytes Absolute: 1.3 10*3/uL — ABNORMAL HIGH (ref 0.1–1.0)
Monocytes Relative: 9 %
Neutro Abs: 8.7 10*3/uL — ABNORMAL HIGH (ref 1.7–7.7)
Neutrophils Relative %: 63 %
Platelets: 336 10*3/uL (ref 150–400)
RBC: 4.42 MIL/uL (ref 3.87–5.11)
RDW: 13.2 % (ref 11.5–15.5)
WBC: 14 10*3/uL — ABNORMAL HIGH (ref 4.0–10.5)
nRBC: 0 % (ref 0.0–0.2)

## 2021-01-14 LAB — ETHANOL: Alcohol, Ethyl (B): <10 mg/dL (ref <10)

## 2021-01-14 LAB — ACETAMINOPHEN LEVEL: Acetaminophen (Tylenol), Serum: <10 ug/mL — ABNORMAL LOW (ref 10–30)

## 2021-01-14 MED ORDER — GABAPENTIN 100 MG PO CAPS
100.0000 mg | ORAL_CAPSULE | Freq: Three times a day (TID) | ORAL | Status: DC
  Administered 2021-01-14 (×2): 100 mg via ORAL
  Filled 2021-01-14 (×2): qty 1

## 2021-01-14 MED ORDER — NICOTINE 14 MG/24HR TD PT24
14.0000 mg | MEDICATED_PATCH | Freq: Every day | TRANSDERMAL | Status: DC
  Administered 2021-01-14: 14 mg via TRANSDERMAL
  Filled 2021-01-14: qty 1

## 2021-01-14 MED ORDER — ACETAMINOPHEN 325 MG PO TABS
650.0000 mg | ORAL_TABLET | Freq: Four times a day (QID) | ORAL | Status: DC | PRN
  Administered 2021-01-14: 650 mg via ORAL
  Filled 2021-01-14: qty 2

## 2021-01-14 NOTE — ED Notes (Signed)
Pt given purple scrubs to change into.   

## 2021-01-14 NOTE — ED Notes (Signed)
Pt up to bathroom, escorted by sitter, and back to room
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
## 2021-01-14 NOTE — ED Notes (Signed)
Patient has angel necklace on a rose colored chain.

## 2021-01-14 NOTE — ED Notes (Signed)
Pt upset at this time that mother IVC'd her. Pt states "she only done this because she has my 3 thousand dollars with her." Explained to pt the IVC process.

## 2021-01-14 NOTE — Consult Note (Signed)
[facility] Face-to-Face Psychiatry Consult   Reason for Consult:  Re-evaluation for overdose Referring Physician:  EDP Patient Identification: Lisa Farrell MRN:  [id number] Principal Diagnosis: Major depressive disorder, recurrent, severe without psychotic features (HCC) Diagnosis:  Principal Problem:   Major depressive disorder, recurrent, severe without psychotic features (HCC)   Total Time spent with patient: 45 minutes  Subjective:   Lisa Farrell is a 25 y.o. female patient admitted with multiple suicide threats and overdose.  HPI:  25 yo female presented under IVC by her mother after finding her unresponsive with blue lips.  Apparently client reports today she took four Klonopin she feels were laced with Fentanyl.  Does not remember anything after taking these pills.  States, "Me and my uncle got into a fight and they said I overdosed, I don't remember."  Denies suicide attempts in the past and current suicidal or homicidal ideations, hallucinations.  Does endorse depression and anxiety, scheduled to go to jail for four months on 3/10 for drug related charges.  Lives with her sister until then.  Reports drug use and plans on detoxing in jail, does not desire detox at this time.  Collateral information from her mother, Daryl Eastern.  She stated the patient's uncle called as Wyn Forster was not acting right.  Her mother took out IVC papers as she is concerned about her drug use.  Then, she found her unresponsive.  Mother states she has been voicing and texting her that she "wished I was dead" and threats to kill herself over the past 3-4 weeks.  Quit her job and has "always been depressed."  Past Psychiatric History: depression, anxiety, substance abuse  Risk to Self:  yes Risk to Others:  none Prior Inpatient Therapy:  once on child/adolescent Prior Outpatient Therapy:  minimal  Past Medical History:  Past Medical History:  Diagnosis Date   BV (bacterial vaginosis) 05/21/2014    History of chlamydia    Patient desires pregnancy 01/27/2014   Vaginal discharge 01/27/2014   History reviewed. No pertinent surgical history. Family History:  Family History  Problem Relation Age of Onset   Diabetes Father    Bipolar disorder Brother    Family Psychiatric  History: brother with bipolar d/o Social History:  Social History   Substance and Sexual Activity  Alcohol Use No     Social History   Substance and Sexual Activity  Drug Use Yes   Frequency: 7.0 times per week   Types: Marijuana    Social History   Socioeconomic History   Marital status: Single    Spouse name: Not on file   Number of children: Not on file   Years of education: Not on file   Highest education level: Not on file  Occupational History   Not on file  Tobacco Use   Smoking status: Current Every Day Smoker    Packs/day: 1.00    Years: 8.00    Pack years: 8.00    Types: Cigarettes   Smokeless tobacco: Never Used  Building services engineer Use: Never used  Substance and Sexual Activity   Alcohol use: No   Drug use: Yes    Frequency: 7.0 times per week    Types: Marijuana   Sexual activity: Yes    Birth control/protection: None  Other Topics Concern   Not on file  Social History Narrative   Not on file   Social Determinants of Health   Financial Resource Strain: Not on file  Food Insecurity: Not  on file  Transportation Needs: Not on file  Physical Activity: Not on file  Stress: Not on file  Social Connections: Not on file   Additional Social History:    Allergies:   Allergies  Allergen Reactions   Zofran [Ondansetron Hcl] Rash    Labs:  Results for orders placed or performed during the hospital encounter of 01/13/21 (from the past 48 hour(s))  Comprehensive metabolic panel     Status: Abnormal   Collection Time: 01/13/21 11:23 PM  Result Value Ref Range   Sodium 136 135 - 145 mmol/L   Potassium 3.5 3.5 - 5.1 mmol/L   Chloride 101 98 - 111  mmol/L   CO2 25 22 - 32 mmol/L   Glucose, Bld 101 (H) 70 - 99 mg/dL    Comment: Glucose reference range applies only to samples taken after fasting for at least 8 hours.   BUN 8 6 - 20 mg/dL   Creatinine, Ser 6.04 0.44 - 1.00 mg/dL   Calcium 8.7 (L) 8.9 - 10.3 mg/dL   Total Protein 7.6 6.5 - 8.1 g/dL   Albumin 4.0 3.5 - 5.0 g/dL   AST 24 15 - 41 U/L   ALT 38 0 - 44 U/L   Alkaline Phosphatase 60 38 - 126 U/L   Total Bilirubin 0.5 0.3 - 1.2 mg/dL   GFR, Estimated >54 >09 mL/min    Comment: (NOTE) Calculated using the CKD-EPI Creatinine Equation (2021)    Anion gap 10 5 - 15    Comment: Performed at [facility], [address], [place], [place] [address]  Ethanol     Status: None   Collection Time: 01/13/21 11:23 PM  Result Value Ref Range   Alcohol, Ethyl (B) <10 <10 mg/dL    Comment: (NOTE) Lowest detectable limit for serum alcohol is 10 mg/dL.  For medical purposes only. Performed at [facility], [address] [place], [place] [address]   CBC with Differential     Status: Abnormal   Collection Time: 01/13/21 11:23 PM  Result Value Ref Range   WBC 14.0 (H) 4.0 - 10.5 K/uL   RBC 4.42 3.87 - 5.11 MIL/uL   Hemoglobin 14.6 12.0 - 15.0 g/dL   HCT 56.2 13.0 - 86.5 %   MCV 98.9 80.0 - 100.0 fL   MCH 33.0 26.0 - 34.0 pg   MCHC 33.4 30.0 - 36.0 g/dL   RDW 78.4 69.6 - 29.5 %   Platelets 336 150 - 400 K/uL   nRBC 0.0 0.0 - 0.2 %   Neutrophils Relative % 63 %   Neutro Abs 8.7 (H) 1.7 - 7.7 K/uL   Lymphocytes Relative 26 %   Lymphs Abs 3.6 0.7 - 4.0 K/uL   Monocytes Relative 9 %   Monocytes Absolute 1.3 (H) 0.1 - 1.0 K/uL   Eosinophils Relative 1 %   Eosinophils Absolute 0.2 0.0 - 0.5 K/uL   Basophils Relative 1 %   Basophils Absolute 0.1 0.0 - 0.1 K/uL   Immature Granulocytes 0 %   Abs Immature Granulocytes 0.05 0.00 - 0.07 K/uL    Comment: Performed at [facility], [address] [place], [place] [address]  Salicylate level     Status: Abnormal   Collection Time:  01/13/21 11:23 PM  Result Value Ref Range   Salicylate Lvl <7.0 (L) 7.0 - 30.0 mg/dL    Comment: Performed at [facility], [address], [place], [place] [address]  Acetaminophen level     Status: Abnormal  Collection Time: 01/13/21 11:23 PM  Result Value Ref Range   Acetaminophen (Tylenol), Serum <10 (L) 10 - 30 ug/mL    Comment: (NOTE) Therapeutic concentrations vary significantly. A range of 10-30 ug/mL  may be an effective concentration for many patients. However, some  are best treated at concentrations outside of this range. Acetaminophen concentrations >150 ug/mL at 4 hours after ingestion  and >50 ug/mL at 12 hours after ingestion are often associated with  toxic reactions.  Performed at [facility], [address] [place], [place] [id number]     No current facility-administered medications for this encounter.   Current Outpatient Medications  Medication Sig Dispense Refill   azithromycin (ZITHROMAX) 500 MG tablet Take 2 po now (Patient not taking: Reported on 01/14/2021) 2 tablet 0   promethazine (PHENERGAN) 25 MG tablet Take 1 tablet (25 mg total) by mouth every 6 (six) hours as needed for nausea or vomiting. (Patient not taking: Reported on 01/14/2021) 10 tablet 0    Musculoskeletal: Strength & Muscle Tone: within normal limits Gait & Station: normal Patient leans: N/A  Psychiatric Specialty Exam: Physical Exam Vitals and nursing note reviewed.  Constitutional:      Appearance: Normal appearance.  HENT:     Head: Normocephalic.     Nose: Nose normal.  Pulmonary:     Effort: Pulmonary effort is normal.  Musculoskeletal:        General: Normal range of motion.     Cervical back: Normal range of motion.  Neurological:     General: No focal deficit present.     Mental Status: She is alert and oriented to person, place, and time.  Psychiatric:        Attention and Perception: Attention and perception normal.        Mood and Affect: Mood is anxious and  depressed.        Speech: Speech normal.        Behavior: Behavior normal. Behavior is cooperative.        Thought Content: Thought content includes suicidal ideation. Thought content includes suicidal plan.        Cognition and Memory: Memory is impaired.        Judgment: Judgment is impulsive.     Review of Systems  Psychiatric/Behavioral: Positive for dysphoric mood and suicidal ideas. The patient is nervous/anxious.   All other systems reviewed and are negative.   Blood pressure 120/60, pulse 98, temperature 98 F (36.7 C), temperature source Oral, resp. rate 18, height 5\' 5"  (1.651 m), weight 104.3 kg, SpO2 98 %.Body mass index is 38.27 kg/m.  General Appearance: Disheveled  Eye Contact:  Fair  Speech:  Normal Rate  Volume:  Normal  Mood:  Anxious, Depressed and Irritable  Affect:  Congruent  Thought Process:  Coherent and Descriptions of Associations: Intact  Orientation:  Full (Time, Place, and Person)  Thought Content:  Rumination  Suicidal Thoughts:  Yes.  with intent/plan  Homicidal Thoughts:  No  Memory:  Immediate;   Fair Recent;   Fair Remote;   Fair  Judgement:  Poor  Insight:  Lacking  Psychomotor Activity:  Decreased  Concentration:  Concentration: Fair and Attention Span: Fair  Recall:  [employer]  Fund of Knowledge:  Fair  Language:  Good  Akathisia:  No  Handed:  Right  AIMS (if indicated):     Assets:  Leisure Time Physical Health Resilience Social Support  ADL's:  Intact  Cognition:  WNL  Sleep:  Treatment Plan Summary: Major depressive disorder, recurrent, severe: -Admit to inpatient psychiatric facility  Polysubstance use d/o: -Recommend detox/rehab -Start gabapentin 100 mg TID  Disposition: Recommend psychiatric Inpatient admission when medically cleared.  Nanine Means, NP 01/14/2021 12:58 PM
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
## 2021-01-14 NOTE — ED Notes (Signed)
Pt has been medically cleared per Dr Judd Lien

## 2021-01-14 NOTE — Progress Notes (Signed)
Per Nanine Means, NP patient meets criteria for inpatient treatment. There are no available beds at [facility] today. CSW faxed referrals to the following facilities for review:  [place]  [facility]  TTS will continue to seek bed placement.  Crissie Reese, MSW, LCSW-A, LCAS-A Phone: [phone] Disposition/TOC

## 2021-01-14 NOTE — ED Notes (Signed)
TTS in progress; pt has been wanded by security

## 2021-01-14 NOTE — ED Notes (Signed)
Pt reports she "spilled the urine out of the cup" when getting up from toilet. Urine sample still needed.

## 2021-01-15 ENCOUNTER — Inpatient Hospital Stay (HOSPITAL_COMMUNITY)
Admission: AD | Admit: 2021-01-15 | Discharge: 2021-01-18 | DRG: 882 | Disposition: A | Discharge status: Home / Self Care | Payer: Federal, State, Local not specified - Other | Source: Intra-hospital | Attending: Psychiatry | Admitting: Psychiatry

## 2021-01-15 ENCOUNTER — Encounter (HOSPITAL_COMMUNITY): Payer: Self-pay | Admitting: Psychiatry

## 2021-01-15 ENCOUNTER — Other Ambulatory Visit: Payer: Self-pay

## 2021-01-15 DIAGNOSIS — Z91410 Personal history of adult physical and sexual abuse: Secondary | ICD-10-CM

## 2021-01-15 DIAGNOSIS — D72829 Elevated white blood cell count, unspecified: Secondary | ICD-10-CM | POA: Diagnosis present

## 2021-01-15 DIAGNOSIS — Z653 Problems related to other legal circumstances: Secondary | ICD-10-CM | POA: Diagnosis not present

## 2021-01-15 DIAGNOSIS — F4323 Adjustment disorder with mixed anxiety and depressed mood: Secondary | ICD-10-CM | POA: Diagnosis present

## 2021-01-15 DIAGNOSIS — Z818 Family history of other mental and behavioral disorders: Secondary | ICD-10-CM | POA: Diagnosis not present

## 2021-01-15 DIAGNOSIS — F1210 Cannabis abuse, uncomplicated: Secondary | ICD-10-CM | POA: Diagnosis present

## 2021-01-15 DIAGNOSIS — R4183 Borderline intellectual functioning: Secondary | ICD-10-CM | POA: Diagnosis present

## 2021-01-15 DIAGNOSIS — Z9151 Personal history of suicidal behavior: Secondary | ICD-10-CM

## 2021-01-15 DIAGNOSIS — F1410 Cocaine abuse, uncomplicated: Secondary | ICD-10-CM | POA: Diagnosis present

## 2021-01-15 DIAGNOSIS — F332 Major depressive disorder, recurrent severe without psychotic features: Secondary | ICD-10-CM | POA: Diagnosis present

## 2021-01-15 DIAGNOSIS — Z888 Allergy status to other drugs, medicaments and biological substances status: Secondary | ICD-10-CM

## 2021-01-15 DIAGNOSIS — Z62810 Personal history of physical and sexual abuse in childhood: Secondary | ICD-10-CM | POA: Diagnosis present

## 2021-01-15 DIAGNOSIS — F17210 Nicotine dependence, cigarettes, uncomplicated: Secondary | ICD-10-CM | POA: Diagnosis present

## 2021-01-15 DIAGNOSIS — G4700 Insomnia, unspecified: Secondary | ICD-10-CM | POA: Diagnosis present

## 2021-01-15 LAB — RESP PANEL BY RT-PCR (FLU A&B, COVID) ARPGX2
Influenza A by PCR: NEGATIVE
Influenza B by PCR: NEGATIVE
SARS Coronavirus 2 by RT PCR: NEGATIVE

## 2021-01-15 LAB — RAPID URINE DRUG SCREEN, HOSP PERFORMED
Amphetamines: POSITIVE — AB
Barbiturates: NOT DETECTED
Benzodiazepines: POSITIVE — AB
Cocaine: POSITIVE — AB
Opiates: NOT DETECTED
Tetrahydrocannabinol: POSITIVE — AB

## 2021-01-15 LAB — PREGNANCY, URINE: Preg Test, Ur: NEGATIVE

## 2021-01-15 MED ORDER — MAGNESIUM HYDROXIDE 400 MG/5ML PO SUSP: 30.0000 mL | Freq: Every day | ORAL | Status: DC | PRN

## 2021-01-15 MED ORDER — TRAZODONE HCL 50 MG PO TABS
50.0000 mg | ORAL_TABLET | Freq: Every evening | ORAL | Status: DC | PRN
  Administered 2021-01-15 – 2021-01-17 (×3): 50 mg via ORAL
  Filled 2021-01-15 (×2): qty 1

## 2021-01-15 MED ORDER — THIAMINE HCL 100 MG PO TABS
100.0000 mg | ORAL_TABLET | Freq: Every day | ORAL | Status: DC
  Filled 2021-01-15 (×4): qty 1

## 2021-01-15 MED ORDER — HYDROXYZINE HCL 25 MG PO TABS
25.0000 mg | ORAL_TABLET | Freq: Four times a day (QID) | ORAL | Status: DC | PRN
  Administered 2021-01-15: 25 mg via ORAL
  Filled 2021-01-15 (×2): qty 1

## 2021-01-15 MED ORDER — LORAZEPAM 1 MG PO TABS: 1.0000 mg | ORAL_TABLET | Freq: Once | ORAL | Status: DC | PRN

## 2021-01-15 MED ORDER — LORAZEPAM 1 MG PO TABS: 1.0000 mg | ORAL_TABLET | Freq: Four times a day (QID) | ORAL | Status: DC | PRN

## 2021-01-15 MED ORDER — ALUM & MAG HYDROXIDE-SIMETH 200-200-20 MG/5ML PO SUSP: 30.0000 mL | ORAL | Status: DC | PRN

## 2021-01-15 MED ORDER — ACETAMINOPHEN 325 MG PO TABS
650.0000 mg | ORAL_TABLET | Freq: Four times a day (QID) | ORAL | Status: DC | PRN
  Administered 2021-01-15 – 2021-01-16 (×2): 650 mg via ORAL
  Filled 2021-01-15 (×2): qty 2

## 2021-01-15 MED ORDER — IBUPROFEN 600 MG PO TABS
600.0000 mg | ORAL_TABLET | Freq: Three times a day (TID) | ORAL | Status: DC | PRN
  Administered 2021-01-17: 600 mg via ORAL
  Filled 2021-01-15: qty 1

## 2021-01-15 MED ORDER — NICOTINE 21 MG/24HR TD PT24
21.0000 mg | MEDICATED_PATCH | Freq: Every day | TRANSDERMAL | Status: DC
  Administered 2021-01-15 – 2021-01-16 (×2): 21 mg via TRANSDERMAL
  Filled 2021-01-15 (×5): qty 1

## 2021-01-15 MED ORDER — LOPERAMIDE HCL 2 MG PO CAPS: 2.0000 mg | ORAL_CAPSULE | ORAL | Status: DC | PRN

## 2021-01-15 MED ORDER — HYDROXYZINE HCL 25 MG PO TABS: 25.0000 mg | ORAL_TABLET | Freq: Three times a day (TID) | ORAL | Status: DC | PRN

## 2021-01-15 MED ORDER — PROMETHAZINE HCL 25 MG PO TABS: 25.0000 mg | ORAL_TABLET | Freq: Three times a day (TID) | ORAL | Status: DC | PRN

## 2021-01-15 MED ORDER — ADULT MULTIVITAMIN W/MINERALS CH
1.0000 | ORAL_TABLET | Freq: Every day | ORAL | Status: DC
  Administered 2021-01-15: 1 via ORAL
  Filled 2021-01-15 (×5): qty 1

## 2021-01-15 NOTE — ED Notes (Signed)
Per [facility] Pt accepted to bed 305-2. Accepting MD: Dr. Jola Babinski

## 2021-01-15 NOTE — [facility] Group Notes (Signed)
The focus of this group is to help patients establish daily goals to achieve during treatment and discuss how the patient can incorporate goal setting into their daily lives to aide in recovery.  Pt did not attend group 

## 2021-01-15 NOTE — Tx Team (Signed)
Initial Treatment Plan 01/15/2021 4:00 AM [name] [id number]    PATIENT STRESSORS: Legal issue Marital or family conflict Substance abuse Traumatic event   PATIENT STRENGTHS: Active sense of humor Average or above average intelligence Communication skills Physical Health   PATIENT IDENTIFIED PROBLEMS: Depression  Substance abuse  Suicidal Ideation (denies at this time, expressed in ED)        "Won't take any more pills I don't know what are"         DISCHARGE CRITERIA:  Improved stabilization in mood, thinking, and/or behavior Motivation to continue treatment in a less acute level of care Need for constant or close observation no longer present Verbal commitment to aftercare and medication compliance Withdrawal symptoms are absent or subacute and managed without 24-hour nursing intervention  PRELIMINARY DISCHARGE PLAN: Attend 12-step recovery group Outpatient therapy Return to previous living arrangement  PATIENT/FAMILY INVOLVEMENT: This treatment plan has been presented to and reviewed with the patient, Lisa Farrell.  The patient and family have been given the opportunity to ask questions and make suggestions.  Juliann Pares, RN 01/15/2021, 4:00 AM

## 2021-01-15 NOTE — Progress Notes (Signed)
Lisa Farrell at [facility] stated that routine MRIs are not done over the weekend. A technician will be in contact with staff at [facility] on 3/7 to schedule a time for the pt to have the MRI done.
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
## 2021-01-15 NOTE — BH Assessment (Signed)
Per Binnie Rail, [facility] at [facility], Pt has been accepted to the service of Dr. Jola Babinski, room 305-2. Number for RN report is [phone]. Sheralyn Boatman, RN was notified of acceptance.   Pamalee Leyden, [facility], [facility] Triage Specialist [phone]
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
## 2021-01-15 NOTE — Progress Notes (Signed)
Patient observed lying down in bed asleep. She was noted to be irritable when awaken to complete assessment. She denies SI/HI. She denies AVH. She denies having any withdrawal symptoms. She was encouraged to notify the nurse if she begins having withdrawal symptoms. She reported that she's tired because she came in around 3 am this morning.  Orders reviewed. Vital signs reviewed. Verbal support provided. 15 minute checks performed for safety.   Patient receptive to treatment plan.

## 2021-01-15 NOTE — Progress Notes (Signed)
Urine preg collected and sent via [facility] courier to [facility] lab.

## 2021-01-15 NOTE — Progress Notes (Addendum)
Psychoeducational Group Note  Date:  01/15/2021 Time:  2030  Group Topic/Focus:  wrap up group  Participation Level: Did Not Attend  Participation Quality:  Not Applicable  Affect:  Not Applicable  Cognitive:  Not Applicable  Insight:  Not Applicable  Engagement in Group: Not Applicable  Additional Comments:  Pt did not attend wrap up group. Positive thinking and self-care were discussed.   Marcille Buffy 01/15/2021, 9:48 PM

## 2021-01-15 NOTE — [facility] Group Notes (Signed)
[facility] Group Notes: (Clinical Social Work)   01/15/2021      Type of Therapy:  Group Therapy   Participation Level:  Did Not Attend despite MHT prompting   Shellia Cleverly, LCSW  01/15/2021 1:22 PM

## 2021-01-16 DIAGNOSIS — F4323 Adjustment disorder with mixed anxiety and depressed mood: Secondary | ICD-10-CM | POA: Diagnosis not present

## 2021-01-16 LAB — CBC WITH DIFFERENTIAL/PLATELET
Abs Immature Granulocytes: 0.03 10*3/uL (ref 0.00–0.07)
Basophils Absolute: 0.1 10*3/uL (ref 0.0–0.1)
Basophils Relative: 1 %
Eosinophils Absolute: 0.1 10*3/uL (ref 0.0–0.5)
Eosinophils Relative: 1 %
HCT: 45.3 % (ref 36.0–46.0)
Hemoglobin: 15.5 g/dL — ABNORMAL HIGH (ref 12.0–15.0)
Immature Granulocytes: 0 %
Lymphocytes Relative: 33 %
Lymphs Abs: 3.8 10*3/uL (ref 0.7–4.0)
MCH: 32.5 pg (ref 26.0–34.0)
MCHC: 34.2 g/dL (ref 30.0–36.0)
MCV: 95 fL (ref 80.0–100.0)
Monocytes Absolute: 1 10*3/uL (ref 0.1–1.0)
Monocytes Relative: 8 %
Neutro Abs: 6.5 10*3/uL (ref 1.7–7.7)
Neutrophils Relative %: 57 %
Platelets: 372 10*3/uL (ref 150–400)
RBC: 4.77 MIL/uL (ref 3.87–5.11)
RDW: 12.9 % (ref 11.5–15.5)
WBC: 11.5 10*3/uL — ABNORMAL HIGH (ref 4.0–10.5)
nRBC: 0 % (ref 0.0–0.2)

## 2021-01-16 LAB — COMPREHENSIVE METABOLIC PANEL
ALT: 21 U/L (ref 0–44)
AST: 16 U/L (ref 15–41)
Albumin: 4.1 g/dL (ref 3.5–5.0)
Alkaline Phosphatase: 51 U/L (ref 38–126)
Anion gap: 10 (ref 5–15)
BUN: 9 mg/dL (ref 6–20)
CO2: 21 mmol/L — ABNORMAL LOW (ref 22–32)
Calcium: 9.4 mg/dL (ref 8.9–10.3)
Chloride: 104 mmol/L (ref 98–111)
Creatinine, Ser: 0.69 mg/dL (ref 0.44–1.00)
GFR, Estimated: >60 mL/min (ref >60)
Glucose, Bld: 81 mg/dL (ref 70–99)
Potassium: 3.5 mmol/L (ref 3.5–5.1)
Sodium: 135 mmol/L (ref 135–145)
Total Bilirubin: 0.9 mg/dL (ref 0.3–1.2)
Total Protein: 7.7 g/dL (ref 6.5–8.1)

## 2021-01-16 LAB — HEMOGLOBIN A1C
Hgb A1c MFr Bld: 5.1 % (ref 4.8–5.6)
Mean Plasma Glucose: 99.67 mg/dL

## 2021-01-16 LAB — TSH: TSH: 0.902 u[IU]/mL (ref 0.350–4.500)

## 2021-01-16 NOTE — Progress Notes (Signed)
D: Patient presents with flat affect and reports that her day was good overall. Patient denies SI/HI at this time. Patient also denies AH/VH at this time. Patient contracts for safety.  A: Provided positive reinforcement and encouragement.  R: Patient cooperative and receptive to efforts. Patient remains safe on the unit.   01/16/21 2119  Psych Admission Type (Psych Patients Only)  Admission Status Involuntary  Psychosocial Assessment  Patient Complaints Substance abuse;Anxiety  Eye Contact Brief  Facial Expression Flat  Affect Flat;Depressed  Speech Logical/coherent  Interaction Assertive  Motor Activity Slow  Appearance/Hygiene In scrubs;Disheveled  Behavior Characteristics Cooperative;Appropriate to situation  Thought Process  Coherency WDL  Content Blaming others  Delusions None reported or observed  Perception WDL  Hallucination None reported or observed  Judgment Impaired  Confusion None  Danger to Self  Current suicidal ideation? Denies  Danger to Others  Danger to Others None reported or observed
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
## 2021-01-16 NOTE — Progress Notes (Signed)
The patient rated her day as a 10 out of 10 since she "mingled" with her peers. Her goal for tomorrow is to get out of her room more than today.

## 2021-01-16 NOTE — Progress Notes (Signed)
Recreation Therapy Notes  Date: 3.7.22 Time: 0930 Location: [address] Dayroom  Group Topic: Stress Management  Goal Area(s) Addresses:  Patient will identify positive stress management techniques. Patient will identify benefits of using stress management post d/c.  Intervention: Stress Management  Activity: Guided Imagery.  LRT read Farrell script that took patients on Farrell relaxing journey on the beach to listen to the peaceful waves and take in the all calming elements.  Patients were to listen as the script was read to engage in the activity.    Education:  Stress Management, Discharge Planning.   Education Outcome: Acknowledges Education  Clinical Observations/Feedback: Pt did not attend group session.    Lisa Farrell, LRT/CTRS         Lisa Farrell 01/16/2021 11:18 AM 
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
## 2021-01-16 NOTE — [facility] Group Notes (Signed)
LCSW Group Therapy Note  Type of Therapy/Topic: Group Therapy: Six Dimensions of Wellness  Participation Level: Did Not Attend  Description of Group: This group will address the concept of wellness and the six concepts of wellness: occupational, physical, social, intellectual, spiritual, and emotional. Patients will be encouraged to process areas in their lives that are out of balance and identify reasons for remaining unbalanced. Patients will be encouraged to explore ways to practice healthy habits daily to attain better physical and mental health outcomes.  Therapeutic Goals: 1. Identify aspects of wellness that they are doing well. 2. Identify aspects of wellness that they would like to improve upon. 3. Identify one action they can take to improve an aspect of wellness in their lives.   Summary of Patient Progress: Patient did not attend.  Therapeutic Modalities: Cognitive Behavioral Therapy Solution-Focused Therapy Relapse Prevention   Saydee Zolman MSW, LCSW Clincal Social Worker  [facility]  
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
## 2021-01-17 ENCOUNTER — Ambulatory Visit (HOSPITAL_COMMUNITY)
Admission: RE | Admit: 2021-01-17 | Discharge: 2021-01-17 | Disposition: A | Discharge status: Home / Self Care | Payer: Self-pay | Source: Ambulatory Visit | Attending: Emergency Medicine | Admitting: Emergency Medicine

## 2021-01-17 DIAGNOSIS — F4323 Adjustment disorder with mixed anxiety and depressed mood: Secondary | ICD-10-CM | POA: Diagnosis not present

## 2021-01-17 DIAGNOSIS — S0080XA Unspecified superficial injury of other part of head, initial encounter: Secondary | ICD-10-CM | POA: Insufficient documentation

## 2021-01-17 NOTE — Progress Notes (Signed)
Pt has returned after getting MRI over at [facility].
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
## 2021-01-17 NOTE — Progress Notes (Addendum)
   01/17/21 2137  Psych Admission Type (Psych Patients Only)  Admission Status Involuntary  Psychosocial Assessment  Patient Complaints None  Eye Contact Brief  Facial Expression Flat  Affect Flat  Speech Logical/coherent  Interaction Assertive  Motor Activity Slow  Appearance/Hygiene Unremarkable  Behavior Characteristics Appropriate to situation  Mood Sad  Thought Process  Coherency WDL  Content Blaming others  Delusions None reported or observed  Perception WDL  Hallucination None reported or observed  Judgment Impaired  Confusion None  Danger to Self  Current suicidal ideation? Denies  Danger to Others  Danger to Others None reported or observed   Pt denies SI, HI, AVH. Pt rates pain in L face 5/10. Pt denies anxiety or depression. Pt says she is ready to be discharged and see her family.
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
## 2021-01-17 NOTE — Progress Notes (Signed)
Pt calm and cooperative this shift.  Pt denies SI/HI/AVH.  Pt is going to [facility] to get MRI completed.  Pt appears to be in no acute distress.  RN will continue to monitor and provide support as needed.  Pt remains safe with q 15 min checks on the unit.

## 2021-01-17 NOTE — [facility] Suicide Risk Assessment (Signed)
[facility] INPATIENT:  Family/Significant Other Suicide Prevention Education  Suicide Prevention Education:  Patient Refusal for Family/Significant Other Suicide Prevention Education: The patient Lisa Farrell has refused to provide written consent for family/significant other to be provided Family/Significant Other Suicide Prevention Education during admission and/or prior to discharge.  Physician notified.  Felizardo Hoffmann 01/17/2021, 8:14 AM

## 2021-01-18 DIAGNOSIS — F4323 Adjustment disorder with mixed anxiety and depressed mood: Secondary | ICD-10-CM | POA: Diagnosis not present

## 2021-01-18 NOTE — Progress Notes (Signed)
D: Pt A & O X 4. Denies SI, HI, AVH and pain at this time. Presents irritable and anxious "I'm just ready to leave". D/C home as ordered. Picked up in lobby by "my mother and she's taking me to jail tomorrow". A: D/C instructions reviewed with pt including prescriptions, medication samples and follow up appointment; compliance encouraged. All belongings from assigned locker given to pt at time of departure. Pt declined all scheduled medications when offered. Safety checks maintained without incident till time of d/c.  R: Pt verbalized understanding related to d/c instructions. Signed belonging sheet in agreement with items received from locker. Ambulatory with a steady gait. Appears to be in no physical distress at time of departure.

## 2021-01-18 NOTE — [facility] Suicide Risk Assessment (Signed)
[facility] Discharge Suicide Risk Assessment   Principal Problem: Adjustment disorder with mixed anxiety and depressed mood Discharge Diagnoses: Principal Problem:   Adjustment disorder with mixed anxiety and depressed mood Active Problems:   Marijuana abuse   Cocaine abuse, episodic use (HCC)   Total Time spent with patient: 20 minutes  Musculoskeletal: Strength & Muscle Tone: within normal limits Gait & Station: normal Patient leans: N/A  Psychiatric Specialty Exam: Review of Systems  All other systems reviewed and are negative.   Blood pressure 117/90, pulse 100, temperature 98.3 F (36.8 C), temperature source Oral, resp. rate 16, height 5\' 5"  (1.651 m), weight 104.3 kg, last menstrual period 12/21/2020, SpO2 100 %.Body mass index is 38.27 kg/m.  General Appearance: Fairly Groomed  02/18/2021::  Fair  Speech:  Normal [id number]  Volume:  Normal  Mood:  Anxious  Affect:  Congruent  Thought Process:  Coherent and Descriptions of Associations: Intact  Orientation:  Full (Time, Place, and Person)  Thought Content:  Logical  Suicidal Thoughts:  No  Homicidal Thoughts:  No  Memory:  Immediate;   Fair Recent;   Fair Remote;   Fair  Judgement:  Intact  Insight:  Fair  Psychomotor Activity:  Normal  Concentration:  Fair  Recall:  [id number] of Knowledge:Fair  Language: Good  Akathisia:  Negative  Handed:  Right  AIMS (if indicated):     Assets:  Desire for Improvement Housing Resilience  Sleep:  Number of Hours: 6.75  Cognition: WNL  ADL's:  Intact   Mental Status Per Nursing Assessment::   On Admission:  Suicidal ideation indicated by others,Self-harm thoughts  Demographic Factors:  Caucasian, Low socioeconomic status and Unemployed  Loss Factors: Legal issues  Historical Factors: Impulsivity  Risk Reduction Factors:   Sense of responsibility to family  Continued Clinical Symptoms:  Depression:   Impulsivity Alcohol/Substance Abuse/Dependencies  Cognitive  Features That Contribute To Risk:  None    Suicide Risk:  Minimal: No identifiable suicidal ideation.  Patients presenting with no risk factors but with morbid ruminations; may be classified as minimal risk based on the severity of the depressive symptoms   Follow-up Information    [facility] Follow up.   Why: Please follow-up with this provider Monday-Friday 8am-5pm during walk-in hours for therapy and medication management.  Contact information: [address] [place], [place] [phone]              Plan Of Care/Follow-up recommendations:  Activity:  ad lib  [phone], MD 01/18/2021, 9:32 AM

## 2021-01-18 NOTE — Progress Notes (Signed)
  [facility] Adult Case Management Discharge Plan :  Will you be returning to the same living situation after discharge:  Yes,  sister's home At discharge, do you have transportation home?: Yes,  mother Do you have the ability to pay for your medications: No.  Release of information consent forms completed and in the chart;  Patient's signature needed at discharge.  Patient to Follow up at:  Follow-up Information    [facility] Follow up.   Why: Please follow-up with this provider Monday-Friday 8am-5pm during walk-in hours for therapy and medication management.  Contact information: [address] [place], [place] [address] [phone]              Next level of care provider has access to [facility] Link:no  Safety Planning and Suicide Prevention discussed: Yes,  w/ pt  Have you used any form of tobacco in the last 30 days? (Cigarettes, Smokeless Tobacco, Cigars, and/or Pipes): Yes  Has patient been referred to the Quitline?: Patient refused referral  Patient has been referred for addiction treatment: Pt. refused referral  Felizardo Hoffmann, LCSWA 01/18/2021, 9:25 AM

## 2021-01-18 NOTE — Progress Notes (Signed)
Recreation Therapy Notes  Date: 3.9.22 Time: 0930 Location: [address] Dayroom  Group Topic: Stress Management  Goal Area(s) Addresses:  Patient will identify positive stress management techniques. Patient will identify benefits of using stress management post d/c.  Intervention: Stress Management  Activity:  Meditation.  LRT played a meditation that focused on taking on the characteristics of a mountain by standing strong in the face of adversity.    Education:  Stress Management, Discharge Planning.   Education Outcome: Acknowledges Education  Clinical Observations/Feedback: Pt did not attend group session.     Caroll Rancher, LRT/CTRS         Caroll Rancher A 01/18/2021 11:19 AM
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
## 2021-09-10 ENCOUNTER — Emergency Department (HOSPITAL_COMMUNITY)
Admission: EM | Admit: 2021-09-10 | Discharge: 2021-09-10 | Discharge status: Against Medical Advice | Payer: Medicaid Other | Attending: Emergency Medicine | Admitting: Emergency Medicine

## 2021-09-10 ENCOUNTER — Emergency Department (HOSPITAL_COMMUNITY): Payer: Medicaid Other

## 2021-09-10 ENCOUNTER — Other Ambulatory Visit: Payer: Self-pay

## 2021-09-10 ENCOUNTER — Encounter (HOSPITAL_COMMUNITY): Payer: Self-pay

## 2021-09-10 DIAGNOSIS — F17210 Nicotine dependence, cigarettes, uncomplicated: Secondary | ICD-10-CM | POA: Diagnosis not present

## 2021-09-10 DIAGNOSIS — R464 Slowness and poor responsiveness: Secondary | ICD-10-CM | POA: Diagnosis not present

## 2021-09-10 DIAGNOSIS — T50901A Poisoning by unspecified drugs, medicaments and biological substances, accidental (unintentional), initial encounter: Secondary | ICD-10-CM

## 2021-09-10 DIAGNOSIS — T424X1A Poisoning by benzodiazepines, accidental (unintentional), initial encounter: Secondary | ICD-10-CM | POA: Insufficient documentation

## 2021-09-10 MED ORDER — NALOXONE HCL 4 MG/0.1ML NA LIQD: NASAL | 0 refills | Status: DC

## 2021-09-10 NOTE — ED Notes (Signed)
Refused labs, urine, covid test

## 2021-09-10 NOTE — ED Notes (Signed)
Pt in room crying

## 2021-09-10 NOTE — ED Provider Notes (Signed)
[facility] EMERGENCY DEPARTMENT Provider Note   CSN: [id number] Arrival date & time: 09/10/21  2505     History Chief Complaint  Patient presents with   Drug Overdose   Level 5 caveat due to altered mental status Lisa Farrell is a 25 y.o. female.  The history is provided by the patient and the EMS personnel.  Drug Overdose This is a new problem. The problem occurs constantly. The problem has been rapidly improving. Nothing aggravates the symptoms.  Patient presents for suspected drug overdose.  Patient was found unresponsive in a motel parking lot.  First responders gave patient 4 mg of Narcan IM and began bag-valve-mask and patient began to improve.  She reports drinking "boot legger"and also using Xanax.  Patient denies any complaints and is requesting discharge    Past Medical History:  Diagnosis Date   BV (bacterial vaginosis) 05/21/2014   History of chlamydia    Patient desires pregnancy 01/27/2014   Vaginal discharge 01/27/2014    Patient Active Problem List   Diagnosis Date Noted   Adjustment disorder with mixed anxiety and depressed mood 01/15/2021   Marijuana abuse 01/15/2021   Cocaine abuse, episodic use (HCC) 01/15/2021   Major depressive disorder, recurrent, severe without psychotic features (HCC) 01/14/2021   BV (bacterial vaginosis) 05/21/2014   Patient desires pregnancy 01/27/2014   Vaginal discharge 01/27/2014    History reviewed. No pertinent surgical history.   OB History     Gravida  1   Para  1   Term      Preterm  1   AB      Living  1      SAB      IAB      Ectopic      Multiple      Live Births              Family History  Problem Relation Age of Onset   Diabetes Father    Bipolar disorder Brother     Social History   Tobacco Use   Smoking status: Every Day    Packs/day: 1.00    Years: 8.00    Pack years: 8.00    Types: Cigarettes   Smokeless tobacco: Never  Vaping Use   Vaping Use: Never used   Substance Use Topics   Alcohol use: No   Drug use: Yes    Frequency: 7.0 times per week    Types: Marijuana    Home Medications Prior to Admission medications   Medication Sig Start Date End Date Taking? Authorizing Provider  naloxone [facility]) nasal spray 4 mg/0.1 mL Use intranasally for overdose/sleepiness 09/10/21  Yes Zadie Rhine, MD    Allergies    Zofran [facility] hcl]  Review of Systems   Review of Systems  Unable to perform ROS: Mental status change   Physical Exam Updated Vital Signs BP (!) 145/112   Pulse (!) 126   Resp 13   Ht 1.651 m (5\' 5" )   Wt 105 kg   LMP 08/25/2021 (Exact Date)   SpO2 95%   BMI 38.52 kg/m   Physical Exam CONSTITUTIONAL: Disheveled, anxious HEAD: Normocephalic/atraumatic EYES: EOMI/PERRL ENMT: Mucous membranes moist NECK: supple no meningeal signs SPINE/BACK:entire spine nontender CV: S1/S2 noted, tachycardic LUNGS: Lungs are clear to auscultation bilaterally, no apparent distress ABDOMEN: soft, nontender NEURO: Pt is awake/alert/appropriate, moves all extremitiesx4.  No facial droop.   EXTREMITIES: pulses normal/equal, full ROM, no wounds or lesions to her extremities SKIN: warm,  color normal PSYCH: Anxious  ED Results / Procedures / Treatments   Labs (all labs ordered are listed, but only abnormal results are displayed) Labs Reviewed  RESP PANEL BY RT-PCR (FLU A&B, COVID) ARPGX2  CBC WITH DIFFERENTIAL/PLATELET  COMPREHENSIVE METABOLIC PANEL  ACETAMINOPHEN LEVEL  ETHANOL  RAPID URINE DRUG SCREEN, HOSP PERFORMED  SALICYLATE LEVEL  I-STAT BETA HCG BLOOD, ED ([facility], [facility], [facility] ONLY)    EKG EKG Interpretation  Date/Time:  "Sunday September 10 2021 00:53:25 EDT Ventricular Rate:  132 PR Interval:  145 QRS Duration: 91 QT Interval:  305 QTC Calculation: 452 R Axis:   18 Text Interpretation: Sinus tachycardia RSR' in V1 or V2, right VCD or RVH Confirmed by Dontavious Emily ([id number]) on 09/10/2021 1:21:04  AM  Radiology DG Chest Port 1 View  Result Date: 09/10/2021 CLINICAL DATA:  Shortness of breath EXAM: PORTABLE CHEST 1 VIEW COMPARISON:  None. FINDINGS: Lungs are clear.  No pleural effusion or pneumothorax. Heart is normal in size. IMPRESSION: No evidence of acute cardiopulmonary disease. Electronically Signed   By: Sriyesh  Krishnan M.D.   On: 09/10/2021 01:32    Procedures Procedures   Medications Ordered in ED Medications - No data to display  ED Course  I have reviewed the triage vital signs and the nursing notes.  Pertinent imaging results that were available during my care of the patient were reviewed by me and considered in my medical decision making (see chart for details).    MDM Rules/Calculators/A&P                           Patient presents for presumed overdose.  She reports drinking alcohol and using Xanax.  However patient was given Narcan and she improved.  Patient reports that her drugs may have been laced with opiates Initially patient was somnolent but soon after was awake and alert. She began to request discharge home.  On repeat assessment, patient is awake and alert and ambulatory.  She does not appear intoxicated.  However I told her that it is possible the Narcan will wear off and she could become somnolent. Patient accepts the risk of worsening condition including death. Patient reports her family members are here picking her up now Plan to give her Narcan at time of discharge and advised her to give to her family and if she has any new symptoms or somnolence to give it intranasally  I discussed risk of death/disability of leaving against medical advice and the patient accepts these risks.  The patient is awake/alert able to make decisions, and does not appear intoxicated Patient discharged against medical advice.   Final Clinical Impression(s) / ED Diagnoses Final diagnoses:  Accidental overdose, initial encounter    Rx / DC Orders ED Discharge  Orders          Ordered    naloxone (NARCAN) nasal spray 4 mg/0.1 mL        10" /30/22 0119             03-02-1974, MD 09/10/21 [phone]

## 2021-09-10 NOTE — ED Notes (Signed)
Pt leaving AMA. Gave Narcan to go.

## 2021-09-10 NOTE — Discharge Instructions (Signed)
Substance Abuse Treatment Programs ° °Intensive Outpatient Programs °[facility]     °[address]      °[place], [place]                   °[phone]      ° °[facility] °[address] °[place], [place] °336-379-7146 ° °[facility]     °(Inpatient and outpatient)     °[address]           °[phone]   ° °[facility] °336-288-1484 (Suboxone and Methadone) ° °[address]      °[place], [place] [address]      °336-882-2125      ° °[address] °[place], [place] °852-3033 ° °[facility] (Outpatient/Inpatient, Chemical)    °(insurance only) [phone]      °       °[facility] (Groups & Residential) °[place], [place] °336-389-1413 ° °   °[facility]     °[address]     °[place], [place]      °[phone]      ° °[facility] (for caregivers and family) °[address] °[place], [place] °336-299-4655 ° ° ° ° ° °Residential Treatment Programs °[facility]      °[address], [place], [place] [address]  °([phone]      ° °[facility] °[address] [place], [place] [address] °[phone] ° °[facility]        °336-248-8914      ° °[facility] °1-800-659-3381 ° °[facility]             °[address]                                         °[place], [place]                                                °877-615-2722 or 336-784-9470                              ° °[facility] [place] °[address] °[place], [address] °1.877.941.8954 ° °[facility]    °[address]      °[place], [place]     °[phone]      ° °[facility] °[address] °[place], [place] °336-285-9073 ° °[facility]   °[address]     °[place], [place] [address]     °336-899-1550      °Admissions: 8am-3pm M-F ° °[facility] °[address] °[place],  [place] °336-227-7417 ° °[facility]: Residential Program (90 Days)   °[place], [place]      °336-725-8389 or 800-758-6077    ° °[facility] °[place],  °(Walk in Hours over the weekend or by referral) ° °[facility] °[address], [place],  [address] °([phone] ° °Crisis Mobile: [facility]:  1-877-626-1772 (for crisis response 24 hours a day) °[facility] Hotline:      1-800-256-2452 °Outpatient Psychiatry and Counseling ° °[facility]: Mobile Crisis   Management 24 hours:  1-877-626-1772 ° °[facility] sliding scale fee and walk in schedule: M-F 8am-12pm/1pm-3pm °[address]  °[place], [place] [address] °[phone] ° °[facility] °[address] °[place], [place] [address] °336-703-9650 ° °[facility] ([facility])- new patient walk-in appointments available Monday - Friday 8am -3pm.          °[address] °[place], [place] [address] °336-676-6840 or crisis line- 336-676-6905 ° °[facility]/ Intensive Outpatient Therapy Program °[address] °[place], [place] [address] °336-832-9804 ° °[facility]                  °Crisis Services      °336.641.4993      °[address]     °[place], [place] [address]                ° °[facility]   °[facility] °[phone] °[address] °[place], [place] [address] ° ° °[facility]          °[address],  °[place], [place] [address]       °([phone] [phone] ° °[facility] °[address] °[place], [place] [address] °336-292-1510 ° °[facility]    °[address]    °[place], [place] [address]     °336-632-3505      ° °Parish McKinney, MD     °[address]     °[place] [address]     °336-282-1251     °  °[facility] °[address] °[place] [address] ° °[facility]     °[address]     °[place], [place]      °336-542-2076      ° °[facility] °Shamsher Ahluwalia, MD °[address] °[place], [place] [address] °[phone] ° °[facility]     °[address]     °[place], [place] [address]     °336-274-1237      ° °[facility] °[address] °[place], [place] [address] °336-854-4450 °Resources for Temporary Residential Assistance/Crisis Centers ° °DAY CENTERS °[facility] °M-F 8am-3pm   °[address] [place], [place] [address]   336-332-0824 °Services include: laundry, barbering, support groups, case management, phone  & computer access, showers, AA/NA mtgs, mental health/substance abuse nurse, job skills class, disability information, VA assistance, spiritual classes, etc.  ° °HOMELESS SHELTERS ° °[facility]     °[facility]   °[address], [place]     °[phone]       °       °[facility] (women and children)       °[address] °[place], [place] [address] °336-275-0820 °[email or web address] for application and process °Application Required ° °[facility]   °[address]    °[place] [address]     °[phone]       °             °[facility] °[address] °[place], [place] [address] °336.273.5572 °336-235-0363(schedule application appt.) °Application Required ° °[facility] (women only)    °[address]     °[place], [place] [address]     °336-884-1039      °  Intake starts 6pm daily °Need valid ID, SSC, & Police report °[facility] [place] °[address] °[place], [place] °336-881-5420 °Application Required ° °[facility] (men only)     °[address]      °[place], [place]     °[phone]      ° °[facility] °(Pregnant women only) °[address] °[place], [place] °336-275-0206 ° °[facility]      °[address]      °[place], [place] [address]     °[phone]      °       °[facility] °[address] °[place], [place] °336-723-1848 °90 day commitment/SA/Application process ° °[facility](men only)     °[address]     °[place], [place]     °336-748-1962       °Check-in at 7pm     °       °[facility] °[address] °[place], [place] [address] °336-248-6684 °Men/Women/Women and Children must be there by 7 pm ° °[facility] °[place], [place] °336-722-8721                ° °

## 2021-09-10 NOTE — ED Notes (Signed)
Pt removing leads and getting dressed stating she wants to leave. Edp notified. Edp now to bedside.

## 2021-09-10 NOTE — ED Notes (Signed)
Iv removed

## 2021-09-10 NOTE — ED Triage Notes (Signed)
Pt arrived via [facility]. Pt found in parking lot of [facility]. Pt was unresponsive upon arrival with agonal respiration. [employer] administered 4mg  Narcan IM and bagged Pt enroute to [facility]. Pt confused upon arrival but alert. Pt reports drinking Bootleg[employer] with the Xanax as well. EDP at bedside during Triage.

## 2021-09-10 NOTE — ED Notes (Signed)
XR at bedside

## 2021-11-21 ENCOUNTER — Emergency Department (HOSPITAL_COMMUNITY)
Admission: EM | Admit: 2021-11-21 | Discharge: 2021-11-22 | Disposition: A | Discharge status: Home / Self Care | Payer: Medicaid Other | Attending: Emergency Medicine | Admitting: Emergency Medicine

## 2021-11-21 ENCOUNTER — Other Ambulatory Visit: Payer: Self-pay

## 2021-11-21 ENCOUNTER — Encounter (HOSPITAL_COMMUNITY): Payer: Self-pay | Admitting: Emergency Medicine

## 2021-11-21 DIAGNOSIS — F17210 Nicotine dependence, cigarettes, uncomplicated: Secondary | ICD-10-CM | POA: Insufficient documentation

## 2021-11-21 DIAGNOSIS — R509 Fever, unspecified: Secondary | ICD-10-CM | POA: Diagnosis present

## 2021-11-21 DIAGNOSIS — U071 COVID-19: Secondary | ICD-10-CM

## 2021-11-21 DIAGNOSIS — Z202 Contact with and (suspected) exposure to infections with a predominantly sexual mode of transmission: Secondary | ICD-10-CM

## 2021-11-21 LAB — RESP PANEL BY RT-PCR (FLU A&B, COVID) ARPGX2
Influenza A by PCR: NEGATIVE
Influenza B by PCR: NEGATIVE
SARS Coronavirus 2 by RT PCR: POSITIVE — AB

## 2021-11-21 NOTE — ED Triage Notes (Signed)
Pt believes she was exposed to an STD and also states she was exposed to someone at work who is positive for Covid. Pt states she has headache, body aches, and vomiting x 1 week.

## 2021-11-22 MED ORDER — METRONIDAZOLE 500 MG PO TABS
2000.0000 mg | ORAL_TABLET | Freq: Once | ORAL | Status: AC
  Administered 2021-11-22: 2000 mg via ORAL
  Filled 2021-11-22: qty 4

## 2021-11-22 NOTE — ED Provider Notes (Signed)
[facility]-EMERGENCY DEPT [facility] Emergency Department Provider Note MRN:  [id number]  Arrival date & time: 11/22/21     Chief Complaint   Multiple complaints History of Present Illness   Lisa Farrell is a 26 y.o. year-old female with no pertinent past medical history presenting to the ED with chief complaint of multiple complaints.  Headache, body aches, intermittent subjective fevers, sore throat for the past week.  Denies shortness of breath, no chest pain, no abdominal pain.  No burning with urination.  Dry cough.  Also was exposed to trichomonas.  Review of Systems  A thorough review of systems was obtained and all systems are negative except as noted in the HPI and PMH.   Patient's Health History    Past Medical History:  Diagnosis Date   BV (bacterial vaginosis) 05/21/2014   History of chlamydia    Patient desires pregnancy 01/27/2014   Vaginal discharge 01/27/2014    History reviewed. No pertinent surgical history.  Family History  Problem Relation Age of Onset   Diabetes Father    Bipolar disorder Brother     Social History   Socioeconomic History   Marital status: Single    Spouse name: Not on file   Number of children: Not on file   Years of education: Not on file   Highest education level: Not on file  Occupational History   Not on file  Tobacco Use   Smoking status: Every Day    Packs/day: 1.00    Years: 8.00    Pack years: 8.00    Types: Cigarettes   Smokeless tobacco: Never  Vaping Use   Vaping Use: Never used  Substance and Sexual Activity   Alcohol use: No   Drug use: Yes    Frequency: 7.0 times per week    Types: Marijuana   Sexual activity: Yes    Birth control/protection: None  Other Topics Concern   Not on file  Social History Narrative   Not on file   Social Determinants of Health   Financial Resource Strain: Not on file  Food Insecurity: Not on file  Transportation Needs: Not on file  Physical Activity: Not on file   Stress: Not on file  Social Connections: Not on file  Intimate Partner Violence: Not on file     Physical Exam   Vitals:   11/21/21 2148  BP: (!) 144/95  Pulse: 96  Resp: 16  Temp: 97.8 F (36.6 C)  SpO2: 99%    CONSTITUTIONAL: Well-appearing, NAD NEURO:  Alert and oriented x 3, no focal deficits EYES:  eyes equal and reactive ENT/NECK:  no LAD, no JVD CARDIO: Regular rate, well-perfused, normal S1 and S2 PULM:  CTAB no wheezing or rhonchi GI/GU:  non-distended, non-tender MSK/SPINE:  No gross deformities, no edema SKIN:  no rash, atraumatic   *Additional and/or pertinent findings included in MDM below  Diagnostic and Interventional Summary    EKG Interpretation  Date/Time:    Ventricular Rate:    PR Interval:    QRS Duration:   QT Interval:    QTC Calculation:   R Axis:     Text Interpretation:         Labs Reviewed  RESP PANEL BY RT-PCR (FLU A&B, COVID) ARPGX2 - Abnormal; Notable for the following components:      Result Value   SARS Coronavirus 2 by RT PCR POSITIVE (*)    All other components within normal limits    No orders to display  Medications  metroNIDAZOLE (FLAGYL) tablet 2,000 mg (has no administration in time range)     Procedures  /  Critical Care Procedures  ED Course and Medical Decision Making  Initial Impression and Ddx Patient is well-appearing sitting comfortably, no increased work of breathing, normal vital signs.  Suspect viral illness, lungs are clear, doubt pneumonia, abdomen soft and nontender, doubt intra-abdominal emergency of any kind, specifically doubt PID given the lack of tenderness, no discharge.  Past medical/surgical history that increases complexity of ED encounter: History of prior STDs  Interpretation of Diagnostics Patient's COVID test is positive, largely explaining patient's symptoms.  Patient Reassessment and Ultimate Disposition/Management Overall patient is doing well, no hypoxia, appropriate for  discharge with symptomatic management at home.  Will treat periodically for trichomonas given her exposure.   Complexity of Problems Addressed Acute complicated illness or Injury  Additional Data Reviewed and Analyzed Further history obtained from: Past medical history and medications listed in the EMR and Prior ED visit notes  Patient Encounter Risk Assessment Low  Elmer Sow. Pilar Plate, MD [facility] Emergency Medicine [facility] [email or web address]  Final Clinical Impressions(s) / ED Diagnoses     ICD-10-CM   1. COVID-19  U07.1     2. Exposure to trichomonas  Z20.2       ED Discharge Orders     None        Discharge Instructions Discussed with and Provided to Patient:    Discharge Instructions      You were evaluated in the Emergency Department and after careful evaluation, we did not find any emergent condition requiring admission or further testing in the hospital.  Your exam/testing today was overall reassuring.  You tested positive for COVID-19.  Continue rest, fluids, over-the-counter medications at home.  We also treated you for exposure to trichomonas.  Please return to the Emergency Department if you experience any worsening of your condition.  Thank you for allowing [place] to be a part of your care.       Sabas Sous, MD 11/22/21 [id number]

## 2021-11-22 NOTE — Discharge Instructions (Signed)
You were evaluated in the Emergency Department and after careful evaluation, we did not find any emergent condition requiring admission or further testing in the hospital.  Your exam/testing today was overall reassuring.  You tested positive for COVID-19.  Continue rest, fluids, over-the-counter medications at home.  We also treated you for exposure to trichomonas.  Please return to the Emergency Department if you experience any worsening of your condition.  Thank you for allowing [place] to be a part of your care.

## 2021-11-29 ENCOUNTER — Emergency Department (HOSPITAL_COMMUNITY)
Admission: EM | Admit: 2021-11-29 | Discharge: 2021-11-29 | Disposition: A | Discharge status: Home / Self Care | Payer: Medicaid Other | Attending: Emergency Medicine | Admitting: Emergency Medicine

## 2021-11-29 ENCOUNTER — Emergency Department (HOSPITAL_COMMUNITY): Payer: Medicaid Other

## 2021-11-29 ENCOUNTER — Encounter (HOSPITAL_COMMUNITY): Payer: Self-pay

## 2021-11-29 ENCOUNTER — Other Ambulatory Visit: Payer: Self-pay

## 2021-11-29 DIAGNOSIS — R112 Nausea with vomiting, unspecified: Secondary | ICD-10-CM | POA: Diagnosis not present

## 2021-11-29 DIAGNOSIS — R000 Tachycardia, unspecified: Secondary | ICD-10-CM | POA: Diagnosis not present

## 2021-11-29 DIAGNOSIS — E860 Dehydration: Secondary | ICD-10-CM | POA: Insufficient documentation

## 2021-11-29 DIAGNOSIS — R350 Frequency of micturition: Secondary | ICD-10-CM | POA: Diagnosis not present

## 2021-11-29 DIAGNOSIS — D72829 Elevated white blood cell count, unspecified: Secondary | ICD-10-CM | POA: Insufficient documentation

## 2021-11-29 DIAGNOSIS — R059 Cough, unspecified: Secondary | ICD-10-CM | POA: Diagnosis present

## 2021-11-29 DIAGNOSIS — U071 COVID-19: Secondary | ICD-10-CM | POA: Diagnosis not present

## 2021-11-29 LAB — COMPREHENSIVE METABOLIC PANEL
ALT: 22 U/L (ref 0–44)
AST: 21 U/L (ref 15–41)
Albumin: 4.3 g/dL (ref 3.5–5.0)
Alkaline Phosphatase: 66 U/L (ref 38–126)
Anion gap: 10 (ref 5–15)
BUN: 22 mg/dL — ABNORMAL HIGH (ref 6–20)
CO2: 26 mmol/L (ref 22–32)
Calcium: 9.2 mg/dL (ref 8.9–10.3)
Chloride: 100 mmol/L (ref 98–111)
Creatinine, Ser: 1.06 mg/dL — ABNORMAL HIGH (ref 0.44–1.00)
GFR, Estimated: >60 mL/min (ref >60)
Glucose, Bld: 84 mg/dL (ref 70–99)
Potassium: 3.6 mmol/L (ref 3.5–5.1)
Sodium: 136 mmol/L (ref 135–145)
Total Bilirubin: 0.8 mg/dL (ref 0.3–1.2)
Total Protein: 7.7 g/dL (ref 6.5–8.1)

## 2021-11-29 LAB — CBC WITH DIFFERENTIAL/PLATELET
Abs Immature Granulocytes: 0.15 10*3/uL — ABNORMAL HIGH (ref 0.00–0.07)
Basophils Absolute: 0.1 10*3/uL (ref 0.0–0.1)
Basophils Relative: 0 %
Eosinophils Absolute: 0 10*3/uL (ref 0.0–0.5)
Eosinophils Relative: 0 %
HCT: 44.2 % (ref 36.0–46.0)
Hemoglobin: 14.7 g/dL (ref 12.0–15.0)
Immature Granulocytes: 1 %
Lymphocytes Relative: 15 %
Lymphs Abs: 2.8 10*3/uL (ref 0.7–4.0)
MCH: 32.2 pg (ref 26.0–34.0)
MCHC: 33.3 g/dL (ref 30.0–36.0)
MCV: 96.7 fL (ref 80.0–100.0)
Monocytes Absolute: 2.2 10*3/uL — ABNORMAL HIGH (ref 0.1–1.0)
Monocytes Relative: 12 %
Neutro Abs: 12.9 10*3/uL — ABNORMAL HIGH (ref 1.7–7.7)
Neutrophils Relative %: 72 %
Platelets: 322 10*3/uL (ref 150–400)
RBC: 4.57 MIL/uL (ref 3.87–5.11)
RDW: 13.3 % (ref 11.5–15.5)
WBC: 18.2 10*3/uL — ABNORMAL HIGH (ref 4.0–10.5)
nRBC: 0 % (ref 0.0–0.2)

## 2021-11-29 LAB — HCG, QUANTITATIVE, PREGNANCY: hCG, Beta Chain, Quant, S: <1 m[IU]/mL (ref <5)

## 2021-11-29 LAB — LIPASE, BLOOD: Lipase: 24 U/L (ref 11–51)

## 2021-11-29 MED ORDER — METOCLOPRAMIDE HCL 5 MG/ML IJ SOLN
10.0000 mg | Freq: Once | INTRAMUSCULAR | Status: AC
  Administered 2021-11-29: 10 mg via INTRAVENOUS
  Filled 2021-11-29: qty 2

## 2021-11-29 MED ORDER — METOCLOPRAMIDE HCL 10 MG PO TABS: 10.0000 mg | ORAL_TABLET | Freq: Four times a day (QID) | ORAL | 0 refills | Status: DC | PRN

## 2021-11-29 MED ORDER — SODIUM CHLORIDE 0.9 % IV BOLUS
1000.0000 mL | Freq: Once | INTRAVENOUS | Status: AC
  Administered 2021-11-29: 1000 mL via INTRAVENOUS

## 2021-11-29 NOTE — ED Notes (Signed)
Pt given water for fluid challenge 

## 2021-11-29 NOTE — ED Provider Notes (Signed)
[facility] EMERGENCY DEPARTMENT Provider Note   CSN: [id number] Arrival date & time: 11/29/21  9528     History  No chief complaint on file.   Lisa Farrell is a 26 y.o. female with no significant past medical history but was diagnosed with COVID-19 here 10 days ago.  She presents today secondary to persistent nausea and vomiting, increasing weakness and lightheadedness.  She denies abdominal pain, also no fevers or chills, no shortness of breath, does continue to have a mild nonproductive cough.  Additionally she was exposed to trichomonas at her last visit and has completed a course of Flagyl, she states she was nauseous before starting this medication and does not feel it was a side effect of this treatment.  She denies diarrhea, dysuria, does endorse reduced urinary frequency.  She has had no treatments prior to arrival for symptom relief.  The history is provided by the patient.      Home Medications Prior to Admission medications   Medication Sig Start Date End Date Taking? Authorizing Provider  metoCLOPramide (REGLAN) 10 MG tablet Take 1 tablet (10 mg total) by mouth every 6 (six) hours as needed for nausea or vomiting. 11/29/21  Yes Inayah Woodin, Raynelle Fanning, PA-C  naloxone [facility]) nasal spray 4 mg/0.1 mL Use intranasally for overdose/sleepiness 09/10/21  Yes Zadie Rhine, MD      Allergies    Zofran [facility] Richards hcl]    Review of Systems   Review of Systems  Constitutional:  Negative for chills and fever.  HENT:  Negative for congestion and sore throat.   Eyes: Negative.   Respiratory:  Positive for cough. Negative for chest tightness and shortness of breath.   Cardiovascular:  Negative for chest pain.  Gastrointestinal:  Positive for nausea and vomiting. Negative for abdominal pain.  Genitourinary: Negative.   Musculoskeletal:  Negative for arthralgias, joint swelling and neck pain.  Skin: Negative.  Negative for rash and wound.  Neurological:  Positive for weakness and  light-headedness. Negative for dizziness, numbness and headaches.  Psychiatric/Behavioral: Negative.    All other systems reviewed and are negative.  Physical Exam Updated Vital Signs BP 113/77 (BP Location: Left Arm)    Pulse 85    Temp 97.7 F (36.5 C) (Oral)    Resp 17    Ht 5\' 2"  (1.575 m)    Wt 81.6 kg    LMP  (Within Weeks)    SpO2 100%    BMI 32.92 kg/m  Physical Exam Vitals and nursing note reviewed.  Constitutional:      Appearance: She is well-developed.  HENT:     Head: Normocephalic and atraumatic.     Mouth/Throat:     Mouth: Mucous membranes are moist.  Eyes:     Conjunctiva/sclera: Conjunctivae normal.  Cardiovascular:     Rate and Rhythm: Regular rhythm. Tachycardia present.     Heart sounds: Normal heart sounds.  Pulmonary:     Effort: Pulmonary effort is normal.     Breath sounds: Normal breath sounds. No wheezing.  Abdominal:     General: Bowel sounds are normal.     Palpations: Abdomen is soft.     Tenderness: There is no abdominal tenderness. There is no guarding or rebound.  Musculoskeletal:        General: Normal range of motion.     Cervical back: Normal range of motion.  Skin:    General: Skin is warm and dry.  Neurological:     Mental Status: She is alert.  ED Results / Procedures / Treatments   Labs (all labs ordered are listed, but only abnormal results are displayed) Labs Reviewed  CBC WITH DIFFERENTIAL/PLATELET - Abnormal; Notable for the following components:      Result Value   WBC 18.2 (*)    Neutro Abs 12.9 (*)    Monocytes Absolute 2.2 (*)    Abs Immature Granulocytes 0.15 (*)    All other components within normal limits  COMPREHENSIVE METABOLIC PANEL - Abnormal; Notable for the following components:   BUN 22 (*)    Creatinine, Ser 1.06 (*)    All other components within normal limits  LIPASE, BLOOD  HCG, QUANTITATIVE, PREGNANCY    EKG None  Radiology DG Chest Port 1 View  Result Date: 11/29/2021 CLINICAL DATA:   Nausea, vomiting, recent COVID. Additional history provided: Patient reports testing positive for COVID 1 week ago. EXAM: PORTABLE CHEST 1 VIEW COMPARISON:  Prior chest radiographs 09/10/2021. FINDINGS: Heart size within normal limits. No appreciable airspace consolidation. No evidence of pleural effusion or pneumothorax. No acute bony abnormality identified. IMPRESSION: No evidence of active cardiopulmonary disease. Electronically Signed   By: Jackey LogeKyle  Golden D.O.   On: 11/29/2021 11:15    Procedures Procedures    Medications Ordered in ED Medications  sodium chloride 0.9 % bolus 1,000 mL (0 mLs Intravenous Stopped 11/29/21 1221)  metoCLOPramide (REGLAN) injection 10 mg (10 mg Intravenous Given 11/29/21 1028)    ED Course/ Medical Decision Making/ A&P                           Medical Decision Making Patient with persistent nausea and vomiting, diagnosed with COVID-19 on January 10.  No shortness of breath, denies fevers, abdominal pain or diarrhea.  Differential diagnosis includes sequelae of her COVID-19 infection, side effect of Flagyl which she had recently completed a course secondary to trichomonas, intra-abdominal process such as acute cholecystitis, hepatitis, pancreatitis.  Of note patient has no abdominal pain complaints.  Amount and/or Complexity of Data Reviewed Labs: ordered.    Details: Labs are reassuring, she is modestly dehydrated with a BUN of 22 and a creatinine of 1.06.  She also has a significant bump in her WBC count at 18.2.  However I suspect this is secondary to the recent COVID infection itself superimposed on acute phase reaction from nausea and vomiting. Radiology: ordered.    Details: Chest x-ray was ordered to rule out COVID-pneumonia as source of nausea, chest x-ray is negative.  Risk Prescription drug management. Decision regarding hospitalization. Risk Details: Patient was given IV fluids and a dose of Reglan here after which her symptoms completely resolved.   She was able to tolerate p.o. intake.  Repeat exam prior to discharge, patient has no abdominal pain, no guarding.   Patient prescribed Reglan, discussed other home treatments and precautions regarding recheck for any new or worsening symptoms.  The patient appears reasonably screened and/or stabilized for discharge and I doubt any other medical condition or other [facility] requiring further screening, evaluation, or treatment in the ED at this time prior to discharge.         Final Clinical Impression(s) / ED Diagnoses Final diagnoses:  Nausea and vomiting, unspecified vomiting type  COVID-19    Rx / DC Orders ED Discharge Orders          Ordered    metoCLOPramide (REGLAN) 10 MG tablet  Every 6 hours PRN  11/29/21 1403              Burgess Amor, PA-C 11/29/21 1418    Bethann Berkshire, MD 11/30/21 1130

## 2021-11-29 NOTE — ED Triage Notes (Signed)
Pt tested positive for covid 1 week ago, states she feels like she isn't getting any better, can't keep anything down, unable to eat.

## 2021-11-29 NOTE — Discharge Instructions (Signed)
Make sure you are drinking plenty of fluids.  You may use additional nausea medicine if your symptom returns, this has been prescribed to you.  Get rechecked for any persistent or worsening symptoms if not improving over the next several days.

## 2022-01-08 ENCOUNTER — Ambulatory Visit: Payer: Medicaid Other | Admitting: Women's Health

## 2022-02-09 ENCOUNTER — Other Ambulatory Visit: Payer: Medicaid Other | Admitting: Adult Health

## 2023-07-06 ENCOUNTER — Encounter (HOSPITAL_COMMUNITY): Payer: Self-pay

## 2023-07-06 ENCOUNTER — Emergency Department (HOSPITAL_COMMUNITY): Payer: MEDICAID

## 2023-07-06 ENCOUNTER — Emergency Department (HOSPITAL_COMMUNITY)
Admission: EM | Admit: 2023-07-06 | Discharge: 2023-07-06 | Disposition: A | Discharge status: Home / Self Care | Payer: MEDICAID | Attending: Emergency Medicine | Admitting: Emergency Medicine

## 2023-07-06 ENCOUNTER — Other Ambulatory Visit: Payer: Self-pay

## 2023-07-06 DIAGNOSIS — M545 Low back pain, unspecified: Secondary | ICD-10-CM | POA: Insufficient documentation

## 2023-07-06 DIAGNOSIS — R1031 Right lower quadrant pain: Secondary | ICD-10-CM | POA: Insufficient documentation

## 2023-07-06 DIAGNOSIS — R109 Unspecified abdominal pain: Secondary | ICD-10-CM | POA: Diagnosis present

## 2023-07-06 LAB — URINALYSIS, ROUTINE W REFLEX MICROSCOPIC
Bilirubin Urine: NEGATIVE
Glucose, UA: NEGATIVE mg/dL
Hgb urine dipstick: NEGATIVE
Ketones, ur: NEGATIVE mg/dL
Leukocytes,Ua: NEGATIVE
Nitrite: NEGATIVE
Protein, ur: NEGATIVE mg/dL
Specific Gravity, Urine: 1.018 (ref 1.005–1.030)
pH: 5 (ref 5.0–8.0)

## 2023-07-06 LAB — CBC WITH DIFFERENTIAL/PLATELET
Abs Immature Granulocytes: 0.03 10*3/uL (ref 0.00–0.07)
Basophils Absolute: 0.1 10*3/uL (ref 0.0–0.1)
Basophils Relative: 1 %
Eosinophils Absolute: 0.3 10*3/uL (ref 0.0–0.5)
Eosinophils Relative: 2 %
HCT: 45.4 % (ref 36.0–46.0)
Hemoglobin: 15.5 g/dL — ABNORMAL HIGH (ref 12.0–15.0)
Immature Granulocytes: 0 %
Lymphocytes Relative: 28 %
Lymphs Abs: 3.4 10*3/uL (ref 0.7–4.0)
MCH: 31.9 pg (ref 26.0–34.0)
MCHC: 34.1 g/dL (ref 30.0–36.0)
MCV: 93.4 fL (ref 80.0–100.0)
Monocytes Absolute: 1.1 10*3/uL — ABNORMAL HIGH (ref 0.1–1.0)
Monocytes Relative: 9 %
Neutro Abs: 7.4 10*3/uL (ref 1.7–7.7)
Neutrophils Relative %: 60 %
Platelets: 293 10*3/uL (ref 150–400)
RBC: 4.86 MIL/uL (ref 3.87–5.11)
RDW: 12.5 % (ref 11.5–15.5)
WBC: 12.2 10*3/uL — ABNORMAL HIGH (ref 4.0–10.5)
nRBC: 0 % (ref 0.0–0.2)

## 2023-07-06 LAB — WET PREP, GENITAL
Clue Cells Wet Prep HPF POC: NONE SEEN
Sperm: NONE SEEN
Trich, Wet Prep: NONE SEEN
WBC, Wet Prep HPF POC: <10 (ref <10)
Yeast Wet Prep HPF POC: NONE SEEN

## 2023-07-06 LAB — LIPASE, BLOOD: Lipase: 25 U/L (ref 11–51)

## 2023-07-06 LAB — COMPREHENSIVE METABOLIC PANEL
ALT: 31 U/L (ref 0–44)
AST: 22 U/L (ref 15–41)
Albumin: 3.7 g/dL (ref 3.5–5.0)
Alkaline Phosphatase: 62 U/L (ref 38–126)
Anion gap: 9 (ref 5–15)
BUN: 8 mg/dL (ref 6–20)
CO2: 22 mmol/L (ref 22–32)
Calcium: 8.9 mg/dL (ref 8.9–10.3)
Chloride: 105 mmol/L (ref 98–111)
Creatinine, Ser: 0.7 mg/dL (ref 0.44–1.00)
GFR, Estimated: >60 mL/min (ref >60)
Glucose, Bld: 113 mg/dL — ABNORMAL HIGH (ref 70–99)
Potassium: 3.9 mmol/L (ref 3.5–5.1)
Sodium: 136 mmol/L (ref 135–145)
Total Bilirubin: 0.5 mg/dL (ref 0.3–1.2)
Total Protein: 6.7 g/dL (ref 6.5–8.1)

## 2023-07-06 LAB — PREGNANCY, URINE: Preg Test, Ur: NEGATIVE

## 2023-07-06 MED ORDER — SODIUM CHLORIDE 0.9 % IV BOLUS
500.0000 mL | Freq: Once | INTRAVENOUS | Status: AC
  Administered 2023-07-06: 500 mL via INTRAVENOUS

## 2023-07-06 MED ORDER — ACETAMINOPHEN 325 MG PO TABS
650.0000 mg | ORAL_TABLET | Freq: Once | ORAL | Status: AC
  Administered 2023-07-06: 650 mg via ORAL
  Filled 2023-07-06: qty 2

## 2023-07-06 MED ORDER — IBUPROFEN 600 MG PO TABS: 600.0000 mg | ORAL_TABLET | Freq: Four times a day (QID) | ORAL | 0 refills | Status: AC | PRN

## 2023-07-06 MED ORDER — KETOROLAC TROMETHAMINE 15 MG/ML IJ SOLN
15.0000 mg | Freq: Once | INTRAMUSCULAR | Status: AC
  Administered 2023-07-06: 15 mg via INTRAVENOUS
  Filled 2023-07-06: qty 1

## 2023-07-06 MED ORDER — IOHEXOL 300 MG/ML  SOLN
100.0000 mL | Freq: Once | INTRAMUSCULAR | Status: AC | PRN
  Administered 2023-07-06: 100 mL via INTRAVENOUS

## 2023-07-06 NOTE — ED Notes (Signed)
Pt ambulated to ED room. No acute distress noted. Vitals WNL.

## 2023-07-06 NOTE — ED Triage Notes (Signed)
POV/ c/o right side flank pain/ hx of cyst on right ovary, pt was suppose to have surgery in may to remove cyst, but pt postponed/ pt also noticed blood in urine today/ pt ambulatory/ A&Ox4

## 2023-07-06 NOTE — ED Provider Notes (Signed)
[place] EMERGENCY DEPARTMENT AT [facility] Provider Note   CSN: [id number] Arrival date & time: 07/06/23  1448     History  Chief Complaint  Patient presents with   Flank Pain    Lisa Farrell is a 27 y.o. female.  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  Presents the ER for right pelvic pain has been ongoing for months, became worse and more sharp this morning, pain is in the right pelvis and right low back.  States she having some vaginal discharge as well.  No fevers or chills, no nausea or vomiting.  No dysuria patient have pelvic pain with urination as well.  Denies being sexually active at this time.  States last period was in June, took a pregnancy test that were negative at home and states she has been told the past that she cannot get pregnant again.  She states has been following up with a gynecologist out of town before she moved back home, they found a cyst on her ovary that it kept growing, they were going to schedule surgery but since she moved she has not been able to get it done.   Flank Pain       Home Medications Prior to Admission medications   Medication Sig Start Date End Date Taking? Authorizing Provider  ibuprofen (ADVIL) 600 MG tablet Take 1 tablet (600 mg total) by mouth every 6 (six) hours as needed. 07/06/23  Yes Wonder Donaway A, PA-C  metoCLOPramide (REGLAN) 10 MG tablet Take 1 tablet (10 mg total) by mouth every 6 (six) hours as needed for nausea or vomiting. 11/29/21   Burgess Amor, PA-C  naloxone [facility]) nasal spray 4 mg/0.1 mL Use intranasally for overdose/sleepiness 09/10/21   Zadie Rhine, MD      Allergies    Zofran Frazier Richards hcl]    Review of Systems   Review of Systems  Genitourinary:  Positive for flank pain.    Physical Exam Updated Vital Signs BP 139/83   Pulse 73   Temp 97.9 F (36.6 C) (Oral)   Resp 20   Ht 5\' 2"  (1.575 m)   Wt 113.4 kg   LMP  04/14/2023 Comment: pt states periods are irregular  SpO2 99%   BMI 45.73 kg/m  Physical Exam Vitals and nursing note reviewed. Exam conducted with a chaperone present.  Constitutional:      General: She is not in acute distress.    Appearance: She is well-developed.  HENT:     Head: Normocephalic and atraumatic.     Mouth/Throat:     Mouth: Mucous membranes are moist.  Eyes:     Extraocular Movements: Extraocular movements intact.     Conjunctiva/sclera: Conjunctivae normal.     Pupils: Pupils are equal, round, and reactive to light.  Cardiovascular:     Rate and Rhythm: Normal rate and regular rhythm.     Heart sounds: No murmur heard. Pulmonary:     Effort: Pulmonary effort is normal. No respiratory distress.     Breath sounds: Normal breath sounds.  Abdominal:     Palpations: Abdomen is soft.     Tenderness: There is no abdominal tenderness.  Genitourinary:    General: Normal vulva.     Exam position: Lithotomy position.     Vagina: Normal.     Cervix: Normal.     Uterus: Normal.      Adnexa: Left adnexa normal.  Right: Tenderness present.   Musculoskeletal:        General: No swelling. Normal range of motion.     Cervical back: Neck supple.  Skin:    General: Skin is warm and dry.     Capillary Refill: Capillary refill takes less than 2 seconds.  Neurological:     General: No focal deficit present.     Mental Status: She is alert and oriented to person, place, and time.  Psychiatric:        Mood and Affect: Mood normal.     ED Results / Procedures / Treatments   Labs (all labs ordered are listed, but only abnormal results are displayed) Labs Reviewed  CBC WITH DIFFERENTIAL/PLATELET - Abnormal; Notable for the following components:      Result Value   WBC 12.2 (*)    Hemoglobin 15.5 (*)    Monocytes Absolute 1.1 (*)    All other components within normal limits  COMPREHENSIVE METABOLIC PANEL - Abnormal; Notable for the following components:    Glucose, Bld 113 (*)    All other components within normal limits  URINALYSIS, ROUTINE W REFLEX MICROSCOPIC - Abnormal; Notable for the following components:   APPearance HAZY (*)    All other components within normal limits  WET PREP, GENITAL  LIPASE, BLOOD  PREGNANCY, URINE  GC/CHLAMYDIA PROBE AMP ([place]) NOT AT [facility]    EKG None  Radiology CT ABDOMEN PELVIS W CONTRAST  Result Date: 07/06/2023 CLINICAL DATA:  Right lower quadrant abdominal pain. Previous ovarian cystic lesion. EXAM: CT ABDOMEN AND PELVIS WITH CONTRAST TECHNIQUE: Multidetector CT imaging of the abdomen and pelvis was performed using the standard protocol following bolus administration of intravenous contrast. RADIATION DOSE REDUCTION: This exam was performed according to the departmental dose-optimization program which includes automated exposure control, adjustment of the mA and/or kV according to patient size and/or use of iterative reconstruction technique. CONTRAST:  OMNIPAQUE IOHEXOL 300 MG/ML  SOLN COMPARISON:  CT 12/22/2017.  03/20/2022 report only. FINDINGS: Lower chest: Slight linear opacity lung base on the right likely scar or atelectasis. No pleural effusion. There is breathing motion. Hepatobiliary: No focal liver abnormality is seen. No gallstones, gallbladder wall thickening, or biliary dilatation. Patent portal vein. Pancreas: Unremarkable. No pancreatic ductal dilatation or surrounding inflammatory changes. Spleen: Normal in size without focal abnormality. Adrenals/Urinary Tract: Adrenal glands are unremarkable. Kidneys are normal, without renal calculi, focal lesion, or hydronephrosis. Bladder is unremarkable. Stomach/Bowel: Stomach is within normal limits. Appendix appears normal. No evidence of bowel wall thickening, distention, or inflammatory changes. Scattered colonic stool. Vascular/Lymphatic: No significant vascular findings are present. No enlarged abdominal or pelvic lymph nodes. Reproductive:  Uterus is present. There is complex cystic area in the left adnexa which is likely ovarian this has some septations. Overall the area measures 4.2 x 4.0 cm today. This could be multiple adjacent cystic foci as well. Please correlate with patient's recent ultrasound from earlier 07/05/2013. Other: No free air or free fluid. Musculoskeletal: Thin sclerotic rim lucent lesion along the left femoral intertrochanteric region measuring 2.9 cm is stable going back to 2019. Benign. IMPRESSION: No bowel obstruction, free air or free fluid. Scattered stool. Normal appendix. Fatty liver infiltration. Complex cystic area in the left ovary. Please correlate with separate ultrasound from earlier 07/06/2023. Stable thin rim sclerotic lucent lesion in the left femur. Benign features. Please correlate with the history Electronically Signed   By: Karen Kays M.D.   On: 07/06/2023 19:33   US PELVIC  COMPLETE W TRANSVAGINAL AND TORSION R/O  Result Date: 07/06/2023 CLINICAL DATA:  Pelvic pain EXAM: TRANSABDOMINAL AND TRANSVAGINAL ULTRASOUND OF PELVIS DOPPLER ULTRASOUND OF OVARIES TECHNIQUE: Both transabdominal and transvaginal ultrasound examinations of the pelvis were performed. Transabdominal technique was performed for global imaging of the pelvis including uterus, ovaries, adnexal regions, and pelvic cul-de-sac. It was necessary to proceed with endovaginal exam following the transabdominal exam to visualize the uterus, endometrium, ovaries and adnexa. Color and duplex Doppler ultrasound was utilized to evaluate blood flow to the ovaries. COMPARISON:  12/22/2017 FINDINGS: Uterus Measurements: 9.2 x 3.8 x 5.0 cm = volume: 90 mL. No fibroids or other mass visualized. Endometrium Thickness: 10 mm in thickness.  No focal abnormality visualized. Right ovary Measurements: 3.0 x 2.7 x 3.5 cm = volume: 14 mL. Normal appearance/no adnexal mass. Left ovary Measurements: 3.2 x 2.4 x 3.0 cm = volume: 12 mL. 2.9 cm functional cyst or dominant  follicle. No adnexal mass. Pulsed Doppler evaluation of both ovaries demonstrates normal low-resistance arterial and venous waveforms. Other findings None IMPRESSION: No acute findings or significant abnormality. No evidence of ovarian torsion. Electronically Signed   By: Charlett Nose M.D.   On: 07/06/2023 18:34    Procedures Procedures    Medications Ordered in ED Medications  acetaminophen (TYLENOL) tablet 650 mg (650 mg Oral Given 07/06/23 1733)  iohexol (OMNIPAQUE) 300 MG/ML solution 100 mL (100 mLs Intravenous Contrast Given 07/06/23 1918)  ketorolac (TORADOL) 15 MG/ML injection 15 mg (15 mg Intravenous Given 07/06/23 1956)  sodium chloride 0.9 % bolus 500 mL (0 mLs Intravenous Stopped 07/06/23 2019)    ED Course/ Medical Decision Making/ A&P                                 Medical Decision Making This patient presents to the ED for concern of right lower quadrant abdominal pain, this involves an extensive number of treatment options, and is a complaint that carries with it a high risk of complications and morbidity.  The differential diagnosis includes but is not limited to appendicitis, ovarian torsion, ectopic pregnancy, TOA, ovarian cyst, kidney stone, UTI, other   Co morbidities that complicate the patient evaluation :   History of ovarian cyst   Additional history obtained:  Additional history obtained from EMR External records from outside source obtained and reviewed including notes   Lab Tests:  I Ordered, and personally interpreted labs.  The pertinent results include: CBC shows mild leukocytosis of 12.2, mild polycythemia which is around her usual, no UTI on urinalysis, lipase is normal, wet prep is negative.   Imaging Studies ordered:  I ordered imaging studies including CT abdomen pelvis which shows cystic lesion left adnexal area I independently visualized and interpreted imaging within scope of identifying emergent findings  I agree with the radiologist  interpretation  Visual lab pelvic ultrasound with torsion rule out, has left ovarian cyst, no right ovarian pathology, normal blood flow   Cardiac Monitoring: / EKG:  The patient was maintained on a cardiac monitor.  I personally viewed and interpreted the cardiac monitored which showed an underlying rhythm of: Sinus rhythm     Problem List / ED Course / Critical interventions / Medication management  Right lower quadrant abdominal pain-patient had pain, was worried it was due to ovarian cyst was going on for a long time and got a lot worse this morning, given her history of a large cyst that  she reports she was going to have surgery for ordered an ultrasound to rule out torsion and this was negative.  CT ordered rule out further etiologies such as kidney stone or appendicitis, as is negative does show cystic lesion near the left adnexa patient is go to follow-up with GYN for this.  She feels much better after Toradol.  No nausea or vomiting.  Incidental finding of stable bone lesion and fatty liver, patient is good to follow with PCP for this.  Advised on follow-up and strict return precautions. Pelvic exam was unremarkable, no vaginal discharge, she is not sexually active, no cervical tenderness do not suspect PID I ordered medication including Toradol for pain Reevaluation of the patient after these medicines showed that the patient improved I have reviewed the patients home medicines and have made adjustments as needed       Amount and/or Complexity of Data Reviewed Radiology: ordered.  Risk OTC drugs. Prescription drug management.           Final Clinical Impression(s) / ED Diagnoses Final diagnoses:  Right lower quadrant abdominal pain    Rx / DC Orders ED Discharge Orders          Ordered    ibuprofen (ADVIL) 600 MG tablet  Every 6 hours PRN        07/06/23 2014              Josem Kaufmann 07/06/23 2034    Bethann Berkshire, MD 07/07/23  [phone]

## 2023-07-06 NOTE — Discharge Instructions (Addendum)
It was a pleasure taking care of you today.  You were seen for right lower abdominal and pelvic pain.  Your ultrasound did not show any emergent findings, we did a CT scan, showed a cystic area near your left ovary.  You to follow-up with gynecology for this though is on the opposite side of your pain so I do not feel that this causing your symptoms.  Your blood work and other tests were very reassuring.  We gave you some medication to help with pain, if you develop fevers, vomiting or severe pain come back to the ER.  Otherwise follow-up with your primary care doctor and gynecology.  Your CT scan incidentally showed fatty liver and a bone lesion on your hip that has been there since 2019 and looks to be benign.  You can follow-up with your primary care doctor regarding these findings.  Regarding the fatty liver, would recommend healthy diet, exercise and PCP follow-up.

## 2023-07-06 NOTE — ED Notes (Signed)
Patient transported to Ultrasound with chaperone Joni Reining, Charity fundraiser.

## 2023-07-08 LAB — GC/CHLAMYDIA PROBE AMP (~~LOC~~) NOT AT ARMC
Chlamydia: NEGATIVE
Comment: NEGATIVE
Comment: NORMAL
Neisseria Gonorrhea: NEGATIVE

## 2023-09-03 ENCOUNTER — Encounter: Payer: Self-pay | Admitting: Family Medicine

## 2023-09-03 ENCOUNTER — Ambulatory Visit (INDEPENDENT_AMBULATORY_CARE_PROVIDER_SITE_OTHER): Payer: MEDICAID | Admitting: Family Medicine

## 2023-09-03 VITALS — BP 120/82 | HR 98 | Ht 63.0 in | Wt 253.1 lb

## 2023-09-03 DIAGNOSIS — N3281 Overactive bladder: Secondary | ICD-10-CM | POA: Diagnosis not present

## 2023-09-03 DIAGNOSIS — K219 Gastro-esophageal reflux disease without esophagitis: Secondary | ICD-10-CM

## 2023-09-03 DIAGNOSIS — F4323 Adjustment disorder with mixed anxiety and depressed mood: Secondary | ICD-10-CM | POA: Diagnosis not present

## 2023-09-03 MED ORDER — PANTOPRAZOLE SODIUM 20 MG PO TBEC
20.0000 mg | DELAYED_RELEASE_TABLET | Freq: Every day | ORAL | 1 refills | Status: DC
Start: 2023-09-03 — End: 2023-12-26

## 2023-09-03 NOTE — Patient Instructions (Addendum)
I appreciate the opportunity to provide care to you today!    Follow up:  1 months headaches  Labs: next visit  GERD: Start taking Protonix 20 mg daily. I recommend adhering to a GERD-friendly diet for optimal symptom management. -For managing GERD, I recommend the following lifestyle changes: -Avoid Certain Foods and Drinks: Limit or eliminate coffee, chocolate, onions, peppermint, spicy foods, carbonated beverages, citrus fruits, tomatoes, garlic, alcohol, and fatty foods such as bacon, burgers, sausages, steak, fried foods, and dairy products. -Recommended Foods: Increase your intake of high-fiber foods including whole grain cereals, oatmeal, brown rice, root vegetables, and non-citrus fruits. Opt for high-protein foods and healthy fats such as avocados, olive oil, nuts, and seeds.   Overactive Bladder: We will provide a sample of Myrbetriq 25 mg to be taken daily. Please verify with your insurance to confirm if this medication is covered.  Nonpharmacological interventions for managing overactive bladder (OAB) can significantly improve symptoms and quality of life. Here are several effective strategies: -Pelvic Floor Exercises (Kegel Exercises) Strengthening the pelvic floor muscles can help improve bladder control. To perform Kegel exercises, contract the pelvic floor muscles (as if stopping the flow of urine), hold for a few seconds, then relax. Repeat several times a day. -Fluid Management Monitor Fluid Intake: While staying hydrated is essential, be mindful of the types and timing of fluid intake. Reduce consumption of diuretics like caffeine and alcohol, especially in the evening. Evening Fluid Restriction: Limit fluid intake a few hours before bedtime to minimize nighttime trips to the bathroom. -Dietary Modifications Avoid bladder irritants such as spicy foods, citrus fruits, artificial sweeteners, and carbonated beverages that may exacerbate symptoms.   Referrals today-integrated  behavioral health for talk therapy  Attached with your AVS, you will find valuable resources for self-education. I highly recommend dedicating some time to thoroughly examine them.   Please continue to a heart-healthy diet and increase your physical activities. Try to exercise for at least five days a week.    It was a pleasure to see you and I look forward to continuing to work together on your health and well-being. Please do not hesitate to call the office if you need care or have questions about your care.  In case of emergency, please visit the Emergency Department for urgent care, or contact our clinic at [phone] to schedule an appointment. We're here to help you!   Have a wonderful day and week. With Gratitude, Gilmore Laroche MSN, FNP-BC

## 2023-09-03 NOTE — Progress Notes (Addendum)
New Patient Office Visit  Subjective:  Patient ID: Lisa Farrell, female    DOB: 11/01/1996  Age: 27 y.o. MRN: [id number]  CC:  Chief Complaint  Patient presents with   New Patient (Initial Visit)    Establishing care today. Was seen on 10/16 by obgyn had some labs done. Would like to discuss anxiety/depression concerns. Has migraine headaches.    Urinary Frequency    Pt reports urinary incontinence at times.     HPI Lisa Farrell is a 27 y.o. female with past medical history of bacterial vaginosis, adjustment disorder with mixed anxiety and depressed moods presents for establishing care.  Overactive Bladder:The patient, who has one child, reports frequent urine leakage with increased activity, coughing, sneezing, and certain movements. She also reports increased urination at night, noting that these symptoms have been ongoing for several years.  Anxiety and Depression:The patient reports frequent crying, sadness, and feelings of hopelessness. 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 She sometimes feels overwhelmed and emotional without understanding why. Today, she declines pharmacological therapy but would like to speak with a counselor. She denies any suicidal thoughts or ideation.    Past Medical History:  Diagnosis Date   BV (bacterial vaginosis) 05/21/2014   History of chlamydia    Patient desires pregnancy 01/27/2014   Vaginal discharge 01/27/2014    History reviewed. No pertinent surgical history.  Family History  Problem Relation Age of Onset   Diabetes Father    Bipolar disorder Brother     Social History   Socioeconomic History   Marital status: Single    Spouse name: Not on file   Number of children: Not on file   Years of education: Not on file   Highest education level: Not on file  Occupational History   Not on file  Tobacco Use    Smoking status: Every Day    Current packs/day: 1.00    Average packs/day: 1 pack/day for 8.0 years (8.0 ttl pk-yrs)    Types: Cigarettes   Smokeless tobacco: Never   Tobacco comments:    1/2 to 1 ppd  Vaping Use   Vaping status: Never Used  Substance and Sexual Activity   Alcohol use: No   Drug use: Yes    Frequency: 7.0 times per week    Types: Marijuana   Sexual activity: Yes    Birth control/protection: None  Other Topics Concern   Not on file  Social History Narrative   Not on file   Social Determinants of Health   Financial Resource Strain: Not on file  Food Insecurity: Not on file  Transportation Needs: Not on file  Physical Activity: Not on file  Stress: Not on file  Social Connections: Not on file  Intimate Partner Violence: Not At Risk (08/28/2023)   Received from [facility]   Humiliation, Afraid, Rape, and Kick questionnaire    Fear of Current or Ex-Partner: No    Emotionally Abused: No    Physically Abused: No    Sexually Abused: No    ROS Review of Systems  Constitutional:  Negative for chills and fever.  Eyes:  Negative for visual disturbance.  Respiratory:  Negative for chest tightness and shortness of breath.   Genitourinary:  Positive for frequency and urgency. Negative for difficulty urinating and dysuria.  Neurological:  Negative for dizziness and headaches.  Psychiatric/Behavioral:  Negative for suicidal  ideas.     Objective:   Today's Vitals: BP 120/82   Pulse 98   Ht 5\' 3"  (1.6 m)   Wt 253 lb 1.9 oz (114.8 kg)   SpO2 97%   BMI 44.84 kg/m   Physical Exam HENT:     Head: Normocephalic.     Mouth/Throat:     Mouth: Mucous membranes are moist.  Cardiovascular:     Rate and Rhythm: Normal rate.     Heart sounds: Normal heart sounds.  Pulmonary:     Effort: Pulmonary effort is normal.     Breath sounds: Normal breath sounds.  Neurological:     Mental Status: She is alert.  Psychiatric:        Mood and Affect: Affect is  tearful.      Assessment & Plan:   Overactive bladder Assessment & Plan: Overactive Bladder: We will provide a sample of Myrbetriq 25 mg to be taken daily.  Encouraged the patient to verify with her insurance to confirm if this medication is covered.  Nonpharmacological interventions for managing overactive bladder (OAB) can significantly improve symptoms and quality of life. Here are several effective strategies: -Pelvic Floor Exercises (Kegel Exercises) Strengthening the pelvic floor muscles can help improve bladder control. To perform Kegel exercises, contract the pelvic floor muscles (as if stopping the flow of urine), hold for a few seconds, then relax. Repeat several times a day. -Fluid Management Monitor Fluid Intake: While staying hydrated is essential, be mindful of the types and timing of fluid intake. Reduce consumption of diuretics like caffeine and alcohol, especially in the evening. Evening Fluid Restriction: Limit fluid intake a few hours before bedtime to minimize nighttime trips to the bathroom.   Orders: -     Urinalysis  Gastroesophageal reflux disease without esophagitis Assessment & Plan: Complains of heartburn indigestion for several years We will treat with Protonix 20 mg daily GERD diet encouraged For managing GERD, I recommend the following lifestyle changes:  Avoid Certain Foods and Drinks: Limit or eliminate coffee, chocolate, onions, peppermint, spicy foods, carbonated beverages, citrus fruits, tomatoes, garlic, alcohol, and fatty foods such as bacon, burgers, sausages, steak, fried foods, and dairy products.  Recommended Foods: Increase your intake of high-fiber foods including whole grain cereals, oatmeal, brown rice, root vegetables, and non-citrus fruits. Opt for high-protein foods and healthy fats such as avocados, olive oil, nuts, and seeds.   Orders: -     Pantoprazole Sodium; Take 1 tablet (20 mg total) by mouth daily.  Dispense: 60 tablet;  Refill: 1  Adjustment disorder with mixed anxiety and depressed mood Assessment & Plan: Referral placed to integrated behavioral health for talk therapy Reviewed nonpharmacological management of anxiety and depression including mindfulness, meditation, and deep breathing exercises Patient and/or legal guardian verbally consented to [facility] services about presenting concerns and psychiatric consultation as appropriate.  The services will be billed as appropriate for the patient   Orders: -     Amb ref to Integrated Behavioral Health   Note: This chart has been completed using Engineer, civil (consulting) software, and while attempts have been made to ensure accuracy, certain words and phrases may not be transcribed as intended.    Follow-up: Return in about 1 month (around 10/04/2023) for migraines.   Gilmore Laroche, FNP

## 2023-09-03 NOTE — Assessment & Plan Note (Signed)
Complains of heartburn indigestion for several years We will treat with Protonix 20 mg daily GERD diet encouraged For managing GERD, I recommend the following lifestyle changes:  Avoid Certain Foods and Drinks: Limit or eliminate coffee, chocolate, onions, peppermint, spicy foods, carbonated beverages, citrus fruits, tomatoes, garlic, alcohol, and fatty foods such as bacon, burgers, sausages, steak, fried foods, and dairy products.  Recommended Foods: Increase your intake of high-fiber foods including whole grain cereals, oatmeal, brown rice, root vegetables, and non-citrus fruits. Opt for high-protein foods and healthy fats such as avocados, olive oil, nuts, and seeds.

## 2023-09-03 NOTE — Assessment & Plan Note (Signed)
Overactive Bladder: We will provide a sample of Myrbetriq 25 mg to be taken daily.  Encouraged the patient to verify with her insurance to confirm if this medication is covered.  Nonpharmacological interventions for managing overactive bladder (OAB) can significantly improve symptoms and quality of life. Here are several effective strategies: -Pelvic Floor Exercises (Kegel Exercises) Strengthening the pelvic floor muscles can help improve bladder control. To perform Kegel exercises, contract the pelvic floor muscles (as if stopping the flow of urine), hold for a few seconds, then relax. Repeat several times a day. -Fluid Management Monitor Fluid Intake: While staying hydrated is essential, be mindful of the types and timing of fluid intake. Reduce consumption of diuretics like caffeine and alcohol, especially in the evening. Evening Fluid Restriction: Limit fluid intake a few hours before bedtime to minimize nighttime trips to the bathroom.

## 2023-09-03 NOTE — Assessment & Plan Note (Signed)
Referral placed to integrated behavioral health for talk therapy Reviewed nonpharmacological management of anxiety and depression including mindfulness, meditation, and deep breathing exercises Patient and/or legal guardian verbally consented to [facility] services about presenting concerns and psychiatric consultation as appropriate.  The services will be billed as appropriate for the patient

## 2023-09-03 NOTE — Addendum Note (Signed)
Addended byGilmore Laroche on: 09/03/2023 06:38 PM   Modules accepted: Level of Service

## 2023-09-04 ENCOUNTER — Encounter: Payer: Self-pay | Admitting: Internal Medicine

## 2023-09-04 ENCOUNTER — Ambulatory Visit (INDEPENDENT_AMBULATORY_CARE_PROVIDER_SITE_OTHER): Payer: MEDICAID | Admitting: Internal Medicine

## 2023-09-04 VITALS — BP 122/79 | HR 96 | Temp 97.9°F | Ht 63.0 in | Wt 253.3 lb

## 2023-09-04 DIAGNOSIS — R140 Abdominal distension (gaseous): Secondary | ICD-10-CM

## 2023-09-04 DIAGNOSIS — R109 Unspecified abdominal pain: Secondary | ICD-10-CM

## 2023-09-04 DIAGNOSIS — K219 Gastro-esophageal reflux disease without esophagitis: Secondary | ICD-10-CM

## 2023-09-04 DIAGNOSIS — K582 Mixed irritable bowel syndrome: Secondary | ICD-10-CM

## 2023-09-04 DIAGNOSIS — K625 Hemorrhage of anus and rectum: Secondary | ICD-10-CM

## 2023-09-04 DIAGNOSIS — K5900 Constipation, unspecified: Secondary | ICD-10-CM

## 2023-09-04 NOTE — H&P (View-Only) (Signed)
Primary Care Physician:  Gilmore Laroche, FNP Primary Gastroenterologist:  Dr. Marletta Lor  Chief Complaint  Patient presents with   Constipation    Referred for constipation and bloating.  Has tried miralax. It helps sometimes.     HPI:   Lisa Farrell is a 27 y.o. female who presents the clinic today by referral from her PCP Gilmore Laroche for evaluation.  Multiple GI complaints for me today.  Rectal bleeding: Patient states she has had intermittent bright red blood per rectum for many months.  Usually just on the tissue paper, occasionally the tissue bowl.  States she was scheduled for colonoscopy but recently moved to our area and has yet to complete this.  Notes family history of "colitis" in her mother and grandmother.  No prior colonoscopy.  No family history of colorectal malignancy.  GERD: Chronic, symptoms daily.  Was recently started on pantoprazole 20 mg daily 2 days ago and states her heartburn has subsided.  No dysphagia odynophagia.  No epigastric or chest pain.  Abdominal bloating, constipation: Patient has had issues with constipation for many years.  Taking MiraLAX which helps some.  Does note significant abdominal bloating, occasional pain.  Relieved with bowel movements.  Notes significant straining.  Bowel movements every few days.  Occasional loose stools.  Past Medical History:  Diagnosis Date   BV (bacterial vaginosis) 05/21/2014   History of chlamydia    Patient desires pregnancy 01/27/2014   Vaginal discharge 01/27/2014    No past surgical history on file.  Current Outpatient Medications  Medication Sig Dispense Refill   ibuprofen (ADVIL) 600 MG tablet Take 1 tablet (600 mg total) by mouth every 6 (six) hours as needed. 30 tablet 0   mirabegron ER (MYRBETRIQ) 25 MG TB24 tablet Take 25 mg by mouth daily.     pantoprazole (PROTONIX) 20 MG tablet Take 1 tablet (20 mg total) by mouth daily. 60 tablet 1   No current facility-administered medications for this  visit.    Allergies as of 09/04/2023 - Review Complete 09/04/2023  Allergen Reaction Noted   Zofran [ondansetron hcl] Rash 09/29/2017    Family History  Problem Relation Age of Onset   Diabetes Father    Bipolar disorder Brother     Social History   Socioeconomic History   Marital status: Single    Spouse name: Not on file   Number of children: Not on file   Years of education: Not on file   Highest education level: Not on file  Occupational History   Not on file  Tobacco Use   Smoking status: Every Day    Current packs/day: 1.00    Average packs/day: 1 pack/day for 8.0 years (8.0 ttl pk-yrs)    Types: Cigarettes    Passive exposure: Current   Smokeless tobacco: Never   Tobacco comments:    1/2 to 1 ppd  Vaping Use   Vaping status: Never Used  Substance and Sexual Activity   Alcohol use: No   Drug use: Yes    Frequency: 7.0 times per week    Types: Marijuana   Sexual activity: Yes    Birth control/protection: None  Other Topics Concern   Not on file  Social History Narrative   Not on file   Social Determinants of Health   Financial Resource Strain: Not on file  Food Insecurity: Not on file  Transportation Needs: Not on file  Physical Activity: Not on file  Stress: Not on file  Social Connections:  Not on file  Intimate Partner Violence: Not At Risk (08/28/2023)   Received from [facility]   Humiliation, Afraid, Rape, and Kick questionnaire    Fear of Current or Ex-Partner: No    Emotionally Abused: No    Physically Abused: No    Sexually Abused: No    Subjective: Review of Systems  Constitutional:  Negative for chills and fever.  HENT:  Negative for congestion and hearing loss.   Eyes:  Negative for blurred vision and double vision.  Respiratory:  Negative for cough and shortness of breath.   Cardiovascular:  Negative for chest pain and palpitations.  Gastrointestinal:  Positive for abdominal pain, blood in stool, constipation and heartburn.  Negative for diarrhea, melena and vomiting.  Genitourinary:  Negative for dysuria and urgency.  Musculoskeletal:  Negative for joint pain and myalgias.  Skin:  Negative for itching and rash.  Neurological:  Negative for dizziness and headaches.  Psychiatric/Behavioral:  Negative for depression. The patient is not nervous/anxious.        Objective: BP 122/79 (BP Location: Right Arm, Patient Position: Sitting, Cuff Size: Large)   Pulse 96   Temp 97.9 F (36.6 C) (Oral)   Ht 5\' 3"  (1.6 m)   Wt 253 lb 4.8 oz (114.9 kg)   BMI 44.87 kg/m  Physical Exam Constitutional:      Appearance: Normal appearance. She is obese.  HENT:     Head: Normocephalic and atraumatic.  Eyes:     Extraocular Movements: Extraocular movements intact.     Conjunctiva/sclera: Conjunctivae normal.  Cardiovascular:     Rate and Rhythm: Normal rate and regular rhythm.  Pulmonary:     Effort: Pulmonary effort is normal.     Breath sounds: Normal breath sounds.  Abdominal:     General: Bowel sounds are normal.     Palpations: Abdomen is soft.  Musculoskeletal:        General: No swelling. Normal range of motion.     Cervical back: Normal range of motion and neck supple.  Skin:    General: Skin is warm and dry.     Coloration: Skin is not jaundiced.  Neurological:     General: No focal deficit present.     Mental Status: She is alert and oriented to person, place, and time.  Psychiatric:        Mood and Affect: Mood normal.        Behavior: Behavior normal.      Assessment/Plan:  1.  Chronic GERD-started on pantoprazole 20 mg daily 2 days ago and states she has not had any reflux symptoms since starting this medication.  Will continue.  No dysphagia odynophagia.  Continue to monitor.  2.  Abdominal pain, constipation-likely patient has chronic irritable bowel syndrome.  Will give samples of Linzess 145 mcg daily and see how she does.  Counseled room to increase or decrease dose depending how she  responds.  Counseled on initial washout.  Call with update next week and I will send in formal prescription.  3.  Rectal bleeding- Will schedule for colonoscopy to further evaluate.The risks including infection, bleed, or perforation as well as benefits, limitations, alternatives and imponderables have been reviewed with the patient. Questions have been answered. All parties agreeable.  Can consider hemorrhoid banding if no other identifiable cause found.  Thank you Gilmore Laroche for the kind referral.  09/04/2023 2:00 PM   Disclaimer: This note was dictated with voice recognition software. Similar sounding words can inadvertently  be transcribed and may not be corrected upon review.

## 2023-09-04 NOTE — Progress Notes (Signed)
Primary Care Physician:  Gilmore Laroche, FNP Primary Gastroenterologist:  Dr. Marletta Lor  Chief Complaint  Patient presents with   Constipation    Referred for constipation and bloating.  Has tried miralax. It helps sometimes.     HPI:   Lisa Farrell is a 27 y.o. female who presents the clinic today by referral from her PCP Gilmore Laroche for evaluation.  Multiple GI complaints for me today.  Rectal bleeding: Patient states she has had intermittent bright red blood per rectum for many months.  Usually just on the tissue paper, occasionally the tissue bowl.  States she was scheduled for colonoscopy but recently moved to our area and has yet to complete this.  Notes family history of "colitis" in her mother and grandmother.  No prior colonoscopy.  No family history of colorectal malignancy.  GERD: Chronic, symptoms daily.  Was recently started on pantoprazole 20 mg daily 2 days ago and states her heartburn has subsided.  No dysphagia odynophagia.  No epigastric or chest pain.  Abdominal bloating, constipation: Patient has had issues with constipation for many years.  Taking MiraLAX which helps some.  Does note significant abdominal bloating, occasional pain.  Relieved with bowel movements.  Notes significant straining.  Bowel movements every few days.  Occasional loose stools.  Past Medical History:  Diagnosis Date   BV (bacterial vaginosis) 05/21/2014   History of chlamydia    Patient desires pregnancy 01/27/2014   Vaginal discharge 01/27/2014    No past surgical history on file.  Current Outpatient Medications  Medication Sig Dispense Refill   ibuprofen (ADVIL) 600 MG tablet Take 1 tablet (600 mg total) by mouth every 6 (six) hours as needed. 30 tablet 0   mirabegron ER (MYRBETRIQ) 25 MG TB24 tablet Take 25 mg by mouth daily.     pantoprazole (PROTONIX) 20 MG tablet Take 1 tablet (20 mg total) by mouth daily. 60 tablet 1   No current facility-administered medications for this  visit.    Allergies as of 09/04/2023 - Review Complete 09/04/2023  Allergen Reaction Noted   Zofran [ondansetron hcl] Rash 09/29/2017    Family History  Problem Relation Age of Onset   Diabetes Father    Bipolar disorder Brother     Social History   Socioeconomic History   Marital status: Single    Spouse name: Not on file   Number of children: Not on file   Years of education: Not on file   Highest education level: Not on file  Occupational History   Not on file  Tobacco Use   Smoking status: Every Day    Current packs/day: 1.00    Average packs/day: 1 pack/day for 8.0 years (8.0 ttl pk-yrs)    Types: Cigarettes    Passive exposure: Current   Smokeless tobacco: Never   Tobacco comments:    1/2 to 1 ppd  Vaping Use   Vaping status: Never Used  Substance and Sexual Activity   Alcohol use: No   Drug use: Yes    Frequency: 7.0 times per week    Types: Marijuana   Sexual activity: Yes    Birth control/protection: None  Other Topics Concern   Not on file  Social History Narrative   Not on file   Social Determinants of Health   Financial Resource Strain: Not on file  Food Insecurity: Not on file  Transportation Needs: Not on file  Physical Activity: Not on file  Stress: Not on file  Social Connections:  Not on file  Intimate Partner Violence: Not At Risk (08/28/2023)   Received from [facility]   Humiliation, Afraid, Rape, and Kick questionnaire    Fear of Current or Ex-Partner: No    Emotionally Abused: No    Physically Abused: No    Sexually Abused: No    Subjective: Review of Systems  Constitutional:  Negative for chills and fever.  HENT:  Negative for congestion and hearing loss.   Eyes:  Negative for blurred vision and double vision.  Respiratory:  Negative for cough and shortness of breath.   Cardiovascular:  Negative for chest pain and palpitations.  Gastrointestinal:  Positive for abdominal pain, blood in stool, constipation and heartburn.  Negative for diarrhea, melena and vomiting.  Genitourinary:  Negative for dysuria and urgency.  Musculoskeletal:  Negative for joint pain and myalgias.  Skin:  Negative for itching and rash.  Neurological:  Negative for dizziness and headaches.  Psychiatric/Behavioral:  Negative for depression. The patient is not nervous/anxious.        Objective: BP 122/79 (BP Location: Right Arm, Patient Position: Sitting, Cuff Size: Large)   Pulse 96   Temp 97.9 F (36.6 C) (Oral)   Ht 5\' 3"  (1.6 m)   Wt 253 lb 4.8 oz (114.9 kg)   BMI 44.87 kg/m  Physical Exam Constitutional:      Appearance: Normal appearance. She is obese.  HENT:     Head: Normocephalic and atraumatic.  Eyes:     Extraocular Movements: Extraocular movements intact.     Conjunctiva/sclera: Conjunctivae normal.  Cardiovascular:     Rate and Rhythm: Normal rate and regular rhythm.  Pulmonary:     Effort: Pulmonary effort is normal.     Breath sounds: Normal breath sounds.  Abdominal:     General: Bowel sounds are normal.     Palpations: Abdomen is soft.  Musculoskeletal:        General: No swelling. Normal range of motion.     Cervical back: Normal range of motion and neck supple.  Skin:    General: Skin is warm and dry.     Coloration: Skin is not jaundiced.  Neurological:     General: No focal deficit present.     Mental Status: She is alert and oriented to person, place, and time.  Psychiatric:        Mood and Affect: Mood normal.        Behavior: Behavior normal.      Assessment/Plan:  1.  Chronic GERD-started on pantoprazole 20 mg daily 2 days ago and states she has not had any reflux symptoms since starting this medication.  Will continue.  No dysphagia odynophagia.  Continue to monitor.  2.  Abdominal pain, constipation-likely patient has chronic irritable bowel syndrome.  Will give samples of Linzess 145 mcg daily and see how she does.  Counseled room to increase or decrease dose depending how she  responds.  Counseled on initial washout.  Call with update next week and I will send in formal prescription.  3.  Rectal bleeding- Will schedule for colonoscopy to further evaluate.The risks including infection, bleed, or perforation as well as benefits, limitations, alternatives and imponderables have been reviewed with the patient. Questions have been answered. All parties agreeable.  Can consider hemorrhoid banding if no other identifiable cause found.  Thank you Gilmore Laroche for the kind referral.  09/04/2023 2:00 PM   Disclaimer: This note was dictated with voice recognition software. Similar sounding words can inadvertently  be transcribed and may not be corrected upon review.

## 2023-09-04 NOTE — Patient Instructions (Signed)
We will schedule you for colonoscopy given your abdominal pain, constipation and rectal bleeding.  For your constipation, I am going to give you samples of a medication called Linzess 145 mcg daily.  We have room to increase or decrease dose depending on how you respond.  You may have initial washout period with this medication.  Linzess works best when taken once a day every day, on an empty stomach, at least 30 minutes before your first meal of the day.  When Linzess is taken daily as directed:  *Constipation relief is typically felt in about a week *IBS-C patients may begin to experience relief from belly pain and overall abdominal symptoms (pain, discomfort, and bloating) in about 1 week,   with symptoms typically improving over 12 weeks.  Diarrhea may occur in the first 2 weeks -keep taking it.  The diarrhea should go away and you should start having normal, complete, full bowel movements. It may be helpful to start treatment when you can be near the comfort of your own bathroom, such as a weekend.   Continue on pantoprazole daily for your chronic acid reflux.  It was very nice meeting you today.  Dr. Marletta Lor

## 2023-09-05 ENCOUNTER — Other Ambulatory Visit: Payer: Self-pay | Admitting: *Deleted

## 2023-09-05 ENCOUNTER — Encounter: Payer: Self-pay | Admitting: *Deleted

## 2023-09-05 DIAGNOSIS — K625 Hemorrhage of anus and rectum: Secondary | ICD-10-CM

## 2023-09-16 ENCOUNTER — Other Ambulatory Visit (HOSPITAL_COMMUNITY)
Admission: RE | Admit: 2023-09-16 | Discharge: 2023-09-16 | Disposition: A | Discharge status: Home / Self Care | Payer: MEDICAID | Source: Ambulatory Visit | Attending: Internal Medicine | Admitting: Internal Medicine

## 2023-09-16 DIAGNOSIS — K625 Hemorrhage of anus and rectum: Secondary | ICD-10-CM | POA: Insufficient documentation

## 2023-09-16 LAB — PREGNANCY, URINE: Preg Test, Ur: NEGATIVE

## 2023-09-17 ENCOUNTER — Encounter (HOSPITAL_COMMUNITY): Payer: Self-pay

## 2023-09-17 ENCOUNTER — Ambulatory Visit (HOSPITAL_COMMUNITY): Payer: MEDICAID | Admitting: Anesthesiology

## 2023-09-17 ENCOUNTER — Encounter (HOSPITAL_COMMUNITY)
Admission: RE | Disposition: A | Discharge status: Home / Self Care | Payer: Self-pay | Source: Home / Self Care | Attending: Internal Medicine

## 2023-09-17 ENCOUNTER — Ambulatory Visit (HOSPITAL_COMMUNITY)
Admission: RE | Admit: 2023-09-17 | Discharge: 2023-09-17 | Disposition: A | Discharge status: Home / Self Care | Payer: MEDICAID | Attending: Internal Medicine | Admitting: Internal Medicine

## 2023-09-17 ENCOUNTER — Other Ambulatory Visit: Payer: Self-pay

## 2023-09-17 DIAGNOSIS — Z79899 Other long term (current) drug therapy: Secondary | ICD-10-CM | POA: Insufficient documentation

## 2023-09-17 DIAGNOSIS — K648 Other hemorrhoids: Secondary | ICD-10-CM | POA: Diagnosis not present

## 2023-09-17 DIAGNOSIS — K219 Gastro-esophageal reflux disease without esophagitis: Secondary | ICD-10-CM | POA: Insufficient documentation

## 2023-09-17 DIAGNOSIS — K635 Polyp of colon: Secondary | ICD-10-CM

## 2023-09-17 DIAGNOSIS — K625 Hemorrhage of anus and rectum: Secondary | ICD-10-CM

## 2023-09-17 DIAGNOSIS — F17210 Nicotine dependence, cigarettes, uncomplicated: Secondary | ICD-10-CM | POA: Insufficient documentation

## 2023-09-17 HISTORY — PX: COLONOSCOPY WITH PROPOFOL: SHX5780

## 2023-09-17 HISTORY — PX: POLYPECTOMY: SHX5525

## 2023-09-17 SURGERY — COLONOSCOPY WITH PROPOFOL
Anesthesia: General

## 2023-09-17 MED ORDER — LIDOCAINE HCL (CARDIAC) PF 100 MG/5ML IV SOSY
PREFILLED_SYRINGE | INTRAVENOUS | Status: DC | PRN
  Administered 2023-09-17: 80 mg via INTRAVENOUS

## 2023-09-17 MED ORDER — LACTATED RINGERS IV SOLN: INTRAVENOUS | Status: DC | PRN

## 2023-09-17 MED ORDER — SODIUM CHLORIDE 0.9% FLUSH: 10.0000 mL | Freq: Two times a day (BID) | INTRAVENOUS | Status: DC

## 2023-09-17 MED ORDER — PROPOFOL 500 MG/50ML IV EMUL
INTRAVENOUS | Status: DC | PRN
  Administered 2023-09-17: 150 ug/kg/min via INTRAVENOUS

## 2023-09-17 MED ORDER — PROPOFOL 10 MG/ML IV BOLUS
INTRAVENOUS | Status: DC | PRN
  Administered 2023-09-17: 80 mg via INTRAVENOUS
  Administered 2023-09-17: 40 mg via INTRAVENOUS

## 2023-09-17 NOTE — Interval H&P Note (Signed)
History and Physical Interval Note:  09/17/2023 9:22 AM  Lisa Farrell  has presented today for surgery, with the diagnosis of rectal bleeding.  The various methods of treatment have been discussed with the patient and family. After consideration of risks, benefits and other options for treatment, the patient has consented to  Procedure(s) with comments: COLONOSCOPY WITH PROPOFOL (N/A) - 9:30 am, asa 2 as a surgical intervention.  The patient's history has been reviewed, patient examined, no change in status, stable for surgery.  I have reviewed the patient's chart and labs.  Questions were answered to the patient's satisfaction.     Lanelle Bal

## 2023-09-17 NOTE — Discharge Instructions (Addendum)
  Colonoscopy Discharge Instructions  Read the instructions outlined below and refer to this sheet in the next few weeks. These discharge instructions provide you with general information on caring for yourself after you leave the hospital. Your doctor may also give you specific instructions. While your treatment has been planned according to the most current medical practices available, unavoidable complications occasionally occur.   ACTIVITY You may resume your regular activity, but move at a slower pace for the next 24 hours.  Take frequent rest periods for the next 24 hours.  Walking will help get rid of the air and reduce the bloated feeling in your belly (abdomen).  No driving for 24 hours (because of the medicine (anesthesia) used during the test).   Do not sign any important legal documents or operate any machinery for 24 hours (because of the anesthesia used during the test).  NUTRITION Drink plenty of fluids.  You may resume your normal diet as instructed by your doctor.  Begin with a light meal and progress to your normal diet. Heavy or fried foods are harder to digest and may make you feel sick to your stomach (nauseated).  Avoid alcoholic beverages for 24 hours or as instructed.  MEDICATIONS You may resume your normal medications unless your doctor tells you otherwise.  WHAT YOU CAN EXPECT TODAY Some feelings of bloating in the abdomen.  Passage of more gas than usual.  Spotting of blood in your stool or on the toilet paper.  IF YOU HAD POLYPS REMOVED DURING THE COLONOSCOPY: No aspirin products for 7 days or as instructed.  No alcohol for 7 days or as instructed.  Eat a soft diet for the next 24 hours.  FINDING OUT THE RESULTS OF YOUR TEST Not all test results are available during your visit. If your test results are not back during the visit, make an appointment with your caregiver to find out the results. Do not assume everything is normal if you have not heard from your  caregiver or the medical facility. It is important for you to follow up on all of your test results.  SEEK IMMEDIATE MEDICAL ATTENTION IF: You have more than a spotting of blood in your stool.  Your belly is swollen (abdominal distention).  You are nauseated or vomiting.  You have a temperature over 101.  You have abdominal pain or discomfort that is severe or gets worse throughout the day.   Overall, your colon appeared very healthy.  I did not see any active inflammation indicative of underlying inflammatory bowel disease such as Crohn's disease or ulcerative colitis throughout your colon or end portion of your small bowel.    Unfortunately, the right side your colon was not adequately prepped today for colonoscopy.  I did not see any evidence of colon cancer or large polyps in this area, but certainly could have missed smaller polyps due to poor visualization.    I did remove 3 small polyps on the left side your colon.  We will call with these results.  You do have internal hemorrhoids which is likely the cause of your bleeding.  We can discuss banding in the office.  We may need to consider early interval colonoscopy in 6 to 12 months to fully evaluate the right side your colon.  Follow-up in 2 to 3 months.    I hope you have a great rest of your week!  Hennie Duos. Marletta Lor, D.O. Gastroenterology and Hepatology [facility]

## 2023-09-17 NOTE — Op Note (Signed)
[facility] Patient Name: Lisa Farrell Procedure Date: 09/17/2023 9:18 AM MRN: [id number] Date of Birth: 1996-06-17 Attending MD: Hennie Duos. Marletta Lor , [place], [id number] CSN: [id number] Age: 27 Admit Type: Outpatient Procedure:                Colonoscopy Indications:              Rectal bleeding Providers:                Hennie Duos. Marletta Lor, DO, Francoise Ceo RN, RN, Zena Amos Referring MD:              Medicines:                See the Anesthesia note for documentation of the                            administered medications Complications:            No immediate complications. Estimated Blood Loss:     Estimated blood loss was minimal. Procedure:                Pre-Anesthesia Assessment:                           - The anesthesia plan was to use monitored                            anesthesia care (MAC).                           After obtaining informed consent, the colonoscope                            was passed under direct vision. Throughout the                            procedure, the patient's blood pressure, pulse, and                            oxygen saturations were monitored continuously. The                            [id number]-[id number] ([id number]) scope was introduced through                            the anus and advanced to the the terminal ileum,                            with identification of the appendiceal orifice and                            IC valve. The colonoscopy was performed without                            difficulty. The patient tolerated the procedure  well. The quality of the bowel preparation was                            evaluated using the BBPS [facility] Bowel Preparation                            Scale) with scores of: Right Colon = 1 (portion of                            mucosa seen, but other areas not well seen due to                            staining, residual stool and/or opaque  liquid),                            Transverse Colon = 2 (minor amount of residual                            staining, small fragments of stool and/or opaque                            liquid, but mucosa seen well) and Left Colon = 3                            (entire mucosa seen well with no residual staining,                            small fragments of stool or opaque liquid). The                            total BBPS score equals 6. The quality of the bowel                            preparation was inadequate. Scope In: 9:32:36 AM Scope Out: 9:50:23 AM Scope Withdrawal Time: 0 hours 12 minutes 30 seconds  Total Procedure Duration: 0 hours 17 minutes 47 seconds  Findings:      Non-bleeding internal hemorrhoids were found during endoscopy.      Three sessile polyps were found in the sigmoid colon and descending       colon. The polyps were 4 to 6 mm in size. These polyps were removed with       a cold snare. Resection and retrieval were complete.      The terminal ileum appeared normal.      A large amount of semi-solid stool was found in the proximal transverse       colon and in the ascending colon, precluding visualization.      The exam was otherwise without abnormality. Impression:               - Preparation of the colon was inadequate.                           - Non-bleeding internal hemorrhoids.                           -  Three 4 to 6 mm polyps in the sigmoid colon and                            in the descending colon, removed with a cold snare.                            Resected and retrieved.                           - The examined portion of the ileum was normal.                           - Stool in the proximal transverse colon and in the                            ascending colon.                           - The examination was otherwise normal. Moderate Sedation:      Per Anesthesia Care Recommendation:           - Patient has a contact number available for                             emergencies. The signs and symptoms of potential                            delayed complications were discussed with the                            patient. Return to normal activities tomorrow.                            Written discharge instructions were provided to the                            patient.                           - Resume previous diet.                           - Continue present medications.                           - Await pathology results.                           - Return to GI clinic in 3 months.                           - Consider early interval colonoscopy in 6-12                            months given R side colon not adequately prepped.                           -  Consider hemorrhoid banding Procedure Code(s):        --- Professional ---                           [phone], Colonoscopy, flexible; with removal of                            tumor(s), polyp(s), or other lesion(s) by snare                            technique Diagnosis Code(s):        --- Professional ---                           K64.8, Other hemorrhoids                           D12.5, Benign neoplasm of sigmoid colon                           D12.4, Benign neoplasm of descending colon                           K62.5, Hemorrhage of anus and rectum CPT copyright 2022 American Medical Association. All rights reserved. The codes documented in this report are preliminary and upon coder review may  be revised to meet current compliance requirements. Hennie Duos. Marletta Lor, DO Hennie Duos. Marletta Lor, DO 09/17/2023 9:56:22 AM This report has been signed electronically. Number of Addenda: 0

## 2023-09-17 NOTE — Transfer of Care (Addendum)
Immediate Anesthesia Transfer of Care Note  Patient: Lisa Farrell  Procedure(s) Performed: COLONOSCOPY WITH PROPOFOL POLYPECTOMY  Patient Location: Endoscopy Unit  Anesthesia Type:General  Level of Consciousness: alert  and patient cooperative  Airway & Oxygen Therapy: Patient Spontanous Breathing  Post-op Assessment: Report given to RN and Post -op Vital signs reviewed and stable  Post vital signs: Reviewed and stable  Last Vitals:  Vitals Value Taken Time  BP 144/67 09/17/23 0957  Temp 36.6 C 09/17/23 0957  Pulse 82 09/17/23 0957  Resp 13 09/17/23 0957  SpO2 99 % 09/17/23 0957    Last Pain:  Vitals:   09/17/23 0957  TempSrc: Oral  PainSc: 0-No pain      Patients Stated Pain Goal: 7 (09/17/23 0813)  Complications: No notable events documented.

## 2023-09-17 NOTE — Anesthesia Preprocedure Evaluation (Signed)
Anesthesia Evaluation  Patient identified by MRN, date of birth, ID band Patient awake    Reviewed: Allergy & Precautions, H&P , NPO status , Patient's Chart, lab work & pertinent test results, reviewed documented beta blocker date and time   Airway Mallampati: II  TM Distance: >3 FB Neck ROM: full    Dental no notable dental hx.    Pulmonary neg pulmonary ROS, Current Smoker and Patient abstained from smoking.   Pulmonary exam normal breath sounds clear to auscultation       Cardiovascular Exercise Tolerance: Good negative cardio ROS  Rhythm:regular Rate:Normal     Neuro/Psych  PSYCHIATRIC DISORDERS  Depression    negative neurological ROS  negative psych ROS   GI/Hepatic negative GI ROS, Neg liver ROS,GERD  ,,  Endo/Other  negative endocrine ROS    Renal/GU negative Renal ROS  negative genitourinary   Musculoskeletal   Abdominal   Peds  Hematology negative hematology ROS (+)   Anesthesia Other Findings   Reproductive/Obstetrics negative OB ROS                             Anesthesia Physical Anesthesia Plan  ASA: 2  Anesthesia Plan: General   Post-op Pain Management:    Induction:   PONV Risk Score and Plan: Propofol infusion  Airway Management Planned:   Additional Equipment:   Intra-op Plan:   Post-operative Plan:   Informed Consent: I have reviewed the patients History and Physical, chart, labs and discussed the procedure including the risks, benefits and alternatives for the proposed anesthesia with the patient or authorized representative who has indicated his/her understanding and acceptance.     Dental Advisory Given  Plan Discussed with: CRNA  Anesthesia Plan Comments:        Anesthesia Quick Evaluation

## 2023-09-18 ENCOUNTER — Ambulatory Visit (INDEPENDENT_AMBULATORY_CARE_PROVIDER_SITE_OTHER): Payer: MEDICAID | Admitting: Family Medicine

## 2023-09-18 ENCOUNTER — Encounter: Payer: Self-pay | Admitting: Family Medicine

## 2023-09-18 VITALS — BP 137/84 | HR 91 | Ht 63.0 in | Wt 253.1 lb

## 2023-09-18 DIAGNOSIS — R0683 Snoring: Secondary | ICD-10-CM

## 2023-09-18 DIAGNOSIS — N3281 Overactive bladder: Secondary | ICD-10-CM

## 2023-09-18 LAB — SURGICAL PATHOLOGY

## 2023-09-18 MED ORDER — MIRABEGRON ER 25 MG PO TB24: 25.0000 mg | ORAL_TABLET | Freq: Every day | ORAL | 1 refills | Status: DC

## 2023-09-18 NOTE — Anesthesia Postprocedure Evaluation (Signed)
Anesthesia Post Note  Patient: Lisa Farrell  Procedure(s) Performed: COLONOSCOPY WITH PROPOFOL POLYPECTOMY  Patient location during evaluation: Phase II Anesthesia Type: General Level of consciousness: awake Pain management: pain level controlled Vital Signs Assessment: post-procedure vital signs reviewed and stable Respiratory status: spontaneous breathing and respiratory function stable Cardiovascular status: blood pressure returned to baseline and stable Postop Assessment: no headache and no apparent nausea or vomiting Anesthetic complications: no Comments: Late entry   No notable events documented.   Last Vitals:  Vitals:   09/17/23 0821 09/17/23 0957  BP: 119/88 (!) 144/67  Pulse: 86 82  Resp: 15 13  Temp: 36.6 C 36.6 C  SpO2: 97% 99%    Last Pain:  Vitals:   09/17/23 0957  TempSrc: Oral  PainSc: 0-No pain                 Windell Norfolk

## 2023-09-18 NOTE — Progress Notes (Signed)
Established Patient Office Visit  Subjective:  Patient ID: Lisa Farrell, female    DOB: Nov 09, 1996  Age: 27 y.o. MRN: [id number]  CC:  Chief Complaint  Patient presents with   Care Management    Pt would like to discuss sleep apnea study , had colonoscopy done yesterday and she stopped breathing while the procedure was being done , advised for her to f/u with pcp for sleep study referral.     HPI Lisa Farrell is a 27 y.o. female with past medical history of GERD presents for sleep apnea referral.  Loud snoring: The patient reports loud snoring and states that during her recent colonoscopy on 09/17/2023, it was noted that she began breathing audibly during the procedure. She admits to loud snoring, excessive daytime sleepiness, and frequent morning headaches. The patient would like to undergo a sleep study to be evaluated for possible sleep apnea.    Past Medical History:  Diagnosis Date   BV (bacterial vaginosis) 05/21/2014   History of chlamydia    Patient desires pregnancy 01/27/2014   Vaginal discharge 01/27/2014    History reviewed. No pertinent surgical history.  Family History  Problem Relation Age of Onset   Diabetes Father    Bipolar disorder Brother     Social History   Socioeconomic History   Marital status: Single    Spouse name: Not on file   Number of children: Not on file   Years of education: Not on file   Highest education level: Not on file  Occupational History   Not on file  Tobacco Use   Smoking status: Every Day    Current packs/day: 0.50    Types: Cigarettes    Passive exposure: Current   Smokeless tobacco: Never   Tobacco comments:    1/2 to 1 ppd  Vaping Use   Vaping status: Never Used  Substance and Sexual Activity   Alcohol use: No   Drug use: Yes    Frequency: 7.0 times per week    Types: Marijuana    Comment: quit 2022   Sexual activity: Yes    Birth control/protection: None  Other Topics Concern   Not on file   Social History Narrative   Not on file   Social Determinants of Health   Financial Resource Strain: Not on file  Food Insecurity: Not on file  Transportation Needs: Not on file  Physical Activity: Not on file  Stress: Not on file  Social Connections: Not on file  Intimate Partner Violence: Not At Risk (08/28/2023)   Received from [facility]   Humiliation, Afraid, Rape, and Kick questionnaire    Fear of Current or Ex-Partner: No    Emotionally Abused: No    Physically Abused: No    Sexually Abused: No    Outpatient Medications Prior to Visit  Medication Sig Dispense Refill   ibuprofen (ADVIL) 600 MG tablet Take 1 tablet (600 mg total) by mouth every 6 (six) hours as needed. 30 tablet 0   pantoprazole (PROTONIX) 20 MG tablet Take 1 tablet (20 mg total) by mouth daily. 60 tablet 1   mirabegron ER (MYRBETRIQ) 25 MG TB24 tablet Take 25 mg by mouth daily.     No facility-administered medications prior to visit.    Allergies  Allergen Reactions   Zofran [Ondansetron Hcl] Rash    ROS Review of Systems  Constitutional:  Negative for chills and fever.  Eyes:  Negative for visual disturbance.  Respiratory:  Negative for  chest tightness and shortness of breath.   Neurological:  Negative for dizziness and headaches.      Objective:    Physical Exam HENT:     Head: Normocephalic.     Mouth/Throat:     Mouth: Mucous membranes are moist.  Cardiovascular:     Rate and Rhythm: Normal rate.     Heart sounds: Normal heart sounds.  Pulmonary:     Effort: Pulmonary effort is normal.     Breath sounds: Normal breath sounds.  Neurological:     Mental Status: She is alert.     BP 137/84   Pulse 91   Ht 5\' 3"  (1.6 m)   Wt 253 lb 1.3 oz (114.8 kg)   LMP 04/24/2023 (Approximate)   SpO2 96%   BMI 44.83 kg/m  Wt Readings from Last 3 Encounters:  09/18/23 253 lb 1.3 oz (114.8 kg)  09/17/23 255 lb (115.7 kg)  09/04/23 253 lb 4.8 oz (114.9 kg)    Lab Results   Component Value Date   TSH 0.902 01/16/2021   Lab Results  Component Value Date   WBC 12.2 (H) 07/06/2023   HGB 15.5 (H) 07/06/2023   HCT 45.4 07/06/2023   MCV 93.4 07/06/2023   PLT 293 07/06/2023   Lab Results  Component Value Date   NA 136 07/06/2023   K 3.9 07/06/2023   CO2 22 07/06/2023   GLUCOSE 113 (H) 07/06/2023   BUN 8 07/06/2023   CREATININE 0.70 07/06/2023   BILITOT 0.5 07/06/2023   ALKPHOS 62 07/06/2023   AST 22 07/06/2023   ALT 31 07/06/2023   PROT 6.7 07/06/2023   ALBUMIN 3.7 07/06/2023   CALCIUM 8.9 07/06/2023   ANIONGAP 9 07/06/2023   Lab Results  Component Value Date   CHOL  07/18/2010    88        ATP III CLASSIFICATION:  <200     mg/dL   Desirable  191-478  mg/dL   Borderline High  >=295    mg/dL   High          Lab Results  Component Value Date   HDL 23 (L) 07/18/2010   Lab Results  Component Value Date   LDLCALC  07/18/2010    51        Total Cholesterol/HDL:CHD Risk Coronary Heart Disease Risk Table                     Men   Women  1/2 Average Risk   3.4   3.3  Average Risk       5.0   4.4  2 X Average Risk   9.6   7.1  3 X Average Risk  23.4   11.0        Use the calculated Patient Ratio above and the CHD Risk Table to determine the patient's CHD Risk.        ATP III CLASSIFICATION (LDL):  <100     mg/dL   Optimal  621-308  mg/dL   Near or Above                    Optimal  130-159  mg/dL   Borderline  657-846  mg/dL   High  >962     mg/dL   Very High   Lab Results  Component Value Date   TRIG 69 07/18/2010   Lab Results  Component Value Date   CHOLHDL 3.8 07/18/2010   Lab Results  Component Value Date   HGBA1C 5.1 01/16/2021      Assessment & Plan:  Loud snoring Assessment & Plan: I encouraged the patient to sleep on her side to help manage her symptoms. I recommended weight reduction through diet and exercise to potentially decrease the severity of her symptoms and address sleep apnea. Additionally, I advised  her to avoid large or heavy meals before bedtime.   Orders: -     Home sleep test  Overactive bladder Assessment & Plan: Refill sent to the pharmacy  Orders: -     Mirabegron ER; Take 1 tablet (25 mg total) by mouth daily.  Dispense: 90 tablet; Refill: 1  Note: This chart has been completed using Engineer, civil (consulting) software, and while attempts have been made to ensure accuracy, certain words and phrases may not be transcribed as intended.    Follow-up: No follow-ups on file.   Gilmore Laroche, FNP

## 2023-09-18 NOTE — Assessment & Plan Note (Signed)
I encouraged the patient to sleep on her side to help manage her symptoms. I recommended weight reduction through diet and exercise to potentially decrease the severity of her symptoms and address sleep apnea. Additionally, I advised her to avoid large or heavy meals before bedtime.

## 2023-09-18 NOTE — Assessment & Plan Note (Signed)
Refill sent to the pharmacy 

## 2023-09-18 NOTE — Patient Instructions (Addendum)
I appreciate the opportunity to provide care to you today!    Follow up:  3 months  Nonpharmacological interventions for sleep apnea, particularly obstructive sleep apnea (OSA), focus on lifestyle changes and physical treatments that can help manage or reduce the severity of the condition without the need for medication.  -Positional Therapy Sleeping position: People with positional obstructive sleep apnea (OSA), where the airway collapse is more pronounced when sleeping on their back, may benefit from sleeping on their side. This can be achieved with techniques like: Positional pillows or devices: Special pillows or devices (e.g., a wedge pillow) can be used to help keep the person from rolling onto their back while sleeping. Tennis ball technique: Sewing a tennis ball into the back of a pajama top can make sleeping on the back uncomfortable, encouraging side-sleeping. Weight Loss Losing weight: Excess weight, particularly around the neck and upper airway, is a significant risk factor for OSA. Reducing body weight through diet and exercise can decrease the severity of symptoms or even resolve sleep apnea in some cases.  Positional and Lifestyle Modifications Avoid alcohol and sedatives: Alcohol and sedative medications relax the muscles in the throat, increasing the risk of airway obstruction. It's recommended to avoid these substances, especially in the hours before bedtime. Avoid heavy meals close to bedtime: Eating large or heavy meals before sleeping can increase the risk of gastroesophageal reflux disease (GERD), which can worsen sleep apnea symptoms.    Please continue to a heart-healthy diet and increase your physical activities. Try to exercise for at least five days a week.    It was a pleasure to see you and I look forward to continuing to work together on your health and well-being. Please do not hesitate to call the office if you need care or have questions about your  care.  In case of emergency, please visit the Emergency Department for urgent care, or contact our clinic at [phone] to schedule an appointment. We're here to help you!   Have a wonderful day and week. With Gratitude, Gilmore Laroche MSN, FNP-BC

## 2023-09-19 ENCOUNTER — Other Ambulatory Visit: Payer: Self-pay | Admitting: Family Medicine

## 2023-09-19 DIAGNOSIS — N3281 Overactive bladder: Secondary | ICD-10-CM

## 2023-09-19 NOTE — Telephone Encounter (Signed)
Copied from CRM [id number]. Topic: Clinical - Medication Refill >> Sep 19, 2023  2:15 PM Desma Mcgregor wrote: Most Recent Primary Care Visit:  Provider: Gilmore Laroche  Department: [facility]  Visit Type: OFFICE VISIT  Date: 09/18/2023  Medication: ***  Has the patient contacted their pharmacy?  (Agent: If no, request that the patient contact the pharmacy for the refill. If patient does not wish to contact the pharmacy document the reason why and proceed with request.) (Agent: If yes, when and what did the pharmacy advise?)  Is this the correct pharmacy for this prescription?  If no, delete pharmacy and type the correct one.  This is the patient's preferred pharmacy:  [id number] - [place], [place] - [address] [place] [address] Phone: [phone] Fax: [phone]   Has the prescription been filled recently?   Is the patient out of the medication?   Has the patient been seen for an appointment in the last year OR does the patient have an upcoming appointment?   Can we respond through MyChart?   Agent: Please be advised that Rx refills may take up to 3 business days. We ask that you follow-up with your pharmacy.

## 2023-09-23 ENCOUNTER — Telehealth: Payer: Self-pay | Admitting: Family Medicine

## 2023-09-23 ENCOUNTER — Encounter (HOSPITAL_COMMUNITY): Payer: Self-pay | Admitting: Internal Medicine

## 2023-09-23 NOTE — Telephone Encounter (Signed)
Copied from CRM [id number]. Topic: Clinical - Prescription Issue >> Sep 23, 2023 10:01 AM Roswell Nickel wrote:     Reason for CRM: patient called regarding her bladder medication stated that pharmacy say she pre-authorization   contact [phone]

## 2023-09-24 NOTE — Telephone Encounter (Signed)
Covermymeds website is currently down and they are working on getting it fixed but no estimated restoration time at the moment

## 2023-09-25 ENCOUNTER — Telehealth: Payer: Self-pay | Admitting: Family Medicine

## 2023-09-25 NOTE — Telephone Encounter (Signed)
Prior auth approved on 09/23/2023, pt informed of this , spoke with pharmacy as well to let them know, they are getting medication ready for her

## 2023-09-25 NOTE — Telephone Encounter (Signed)
Patient called need prior authorization and refill mirabegron ER (MYRBETRIQ) 25 MG TB24 tablet [[id number]]  Patient been trying to get medicine all week. Patient is completely out of her samples. Can she come by our office to get more samples until she can get her prescription.  Patient call back #  [phone]  Pharmacy: [facility] [place]

## 2023-10-07 ENCOUNTER — Ambulatory Visit: Payer: MEDICAID | Admitting: Family Medicine

## 2023-10-16 ENCOUNTER — Other Ambulatory Visit: Payer: Self-pay | Admitting: Internal Medicine

## 2023-10-16 ENCOUNTER — Telehealth: Payer: Self-pay

## 2023-10-16 MED ORDER — LINACLOTIDE 145 MCG PO CAPS: 145.0000 ug | ORAL_CAPSULE | Freq: Every day | ORAL | 3 refills | Status: AC

## 2023-10-16 NOTE — Telephone Encounter (Signed)
Sent to pharmacy, thanks 

## 2023-10-16 NOTE — Telephone Encounter (Signed)
Pt phoned advising the Linzess samples worked for her. Pt wants a formal Rx for Linzess sent to [facility] / [address].

## 2023-10-20 NOTE — Progress Notes (Unsigned)
   Established Patient Office Visit   Subjective  Patient ID: Lisa Farrell, female    DOB: 08/26/1996  Age: 27 y.o. MRN: [id number]  No chief complaint on file.   She  has a past medical history of BV (bacterial vaginosis) (05/21/2014), History of chlamydia, Patient desires pregnancy (01/27/2014), and Vaginal discharge (01/27/2014).  HPI  ROS    Objective:     There were no vitals taken for this visit. {Vitals History (Optional):23777}  Physical Exam   No results found for any visits on 10/21/23.  The ASCVD Risk score (Arnett DK, et al., 2019) failed to calculate for the following reasons:   The 2019 ASCVD risk score is only valid for ages 42 to 49    Assessment & Plan:  There are no diagnoses linked to this encounter.  No follow-ups on file.   Cruzita Lederer Newman Nip, FNP

## 2023-10-20 NOTE — Patient Instructions (Signed)

## 2023-10-21 ENCOUNTER — Encounter: Payer: MEDICAID | Admitting: Family Medicine

## 2023-10-21 NOTE — Progress Notes (Signed)
NO SHOW

## 2023-11-07 ENCOUNTER — Ambulatory Visit: Payer: MEDICAID | Admitting: Family Medicine

## 2023-11-18 NOTE — Progress Notes (Signed)
 Established Patient Office Visit   Subjective  Patient ID: Lisa Farrell, female    DOB: Mar 29, 1996  Age: 28 y.o. MRN: [id number]  Chief Complaint  Patient presents with   Follow-up    followup for headaches:  Pt states she is requesting a sleep study. She believes she has sleep apnea. She has been heavy snoring to the point family has noticed, she wakes up gasping for air frequently and she wakes up for the day with headaches,  , went to her Gyn doctor and said patient whiteblood count is high she wants to ensure that there is no infection going on.     She  has a past medical history of BV (bacterial vaginosis) (05/21/2014), History of chlamydia, Patient desires pregnancy (01/27/2014), and Vaginal discharge (01/27/2014).  Loud Snoring Patient reports experiencing symptoms over the past 6 months. She describes episodes of shortness of breath while sleeping, accompanied by loud snoring. Family members have observed periods where she appears to stop breathing during sleep. The patient also reports daytime fatigue and occasional palpitations.  Chronic Migraines  Patient reports a chronic headache that has been present for over a year, occurring constantly and primarily every morning. The pain has been gradually worsening and is localized to the frontal and temporal regions, without radiation. She describes the pain as pulsating, aching, and throbbing, with a severity of 6/10.Associated symptoms include scalp tenderness and visual changes. Pertinent negatives include no dizziness, fever, loss of balance, neck pain, numbness, or photophobia. The pain is exacerbated by waking up in the morning, which is when it is most intense. The patient has tried Excedrin, which has provided only mild relief.    Review of Systems  Constitutional:  Negative for chills and fever.  Eyes:  Negative for blurred vision.  Respiratory:  Positive for shortness of breath.   Cardiovascular:  Negative for chest  pain.  Neurological:  Positive for headaches. Negative for dizziness.      Objective:     BP 122/76   Pulse 100   Ht 5' 3 (1.6 m)   Wt 259 lb (117.5 kg)   SpO2 96%   BMI 45.88 kg/m  BP Readings from Last 3 Encounters:  11/19/23 122/76  09/18/23 137/84  09/17/23 (!) 144/67      Physical Exam Vitals reviewed.  Constitutional:      General: She is not in acute distress.    Appearance: Normal appearance. She is not ill-appearing, toxic-appearing or diaphoretic.  HENT:     Head: Normocephalic.  Eyes:     General:        Right eye: No discharge.        Left eye: No discharge.     Conjunctiva/sclera: Conjunctivae normal.  Cardiovascular:     Pulses: Normal pulses.     Heart sounds: Normal heart sounds.  Pulmonary:     Effort: Pulmonary effort is normal. No respiratory distress.     Breath sounds: Normal breath sounds.  Abdominal:     General: Bowel sounds are normal.     Palpations: Abdomen is soft.     Tenderness: There is no abdominal tenderness. There is no right CVA tenderness, left CVA tenderness or guarding.  Musculoskeletal:     Cervical back: Normal range of motion.  Skin:    General: Skin is warm and dry.     Capillary Refill: Capillary refill takes less than 2 seconds.  Neurological:     Mental Status: She is alert.  Psychiatric:  Mood and Affect: Mood normal.      No results found for any visits on 11/19/23.  The ASCVD Risk score (Arnett DK, et al., 2019) failed to calculate for the following reasons:   The 2019 ASCVD risk score is only valid for ages 30 to 66    Assessment & Plan:  Loud snoring Assessment & Plan: Stop bang Score 3 points Referral placed to sleep studies    Sleep apnea, unspecified type -     Ambulatory referral to Sleep Studies  Chronic migraine without aura without status migrainosus, not intractable -     Propranolol  HCl; Take 1 tablet (10 mg total) by mouth every 12 (twelve) hours as needed.  Dispense: 60  tablet; Refill: 1  Chronic migraine w/o aura w/o status migrainosus, not intractable Assessment & Plan: Trial on Propranolol  10 mg Advise methods include deep breathing, cognitive behavioral therapy focusing on changing thoughts, Limit or avoid alcohol, caffeine, chocolate, canner foods, MSG and aspartame.  Implement sleep hygiene includes not watching TV or listen music in bed, don't eat heavy meals within a couple of hours of bedtime, don't use your phone, laptop, or tablet at bedtime       Return if symptoms worsen or fail to improve.   Hilario Kidd Wilhelmena Falter, FNP

## 2023-11-18 NOTE — Patient Instructions (Signed)

## 2023-11-19 ENCOUNTER — Ambulatory Visit (INDEPENDENT_AMBULATORY_CARE_PROVIDER_SITE_OTHER): Payer: MEDICAID | Admitting: Family Medicine

## 2023-11-19 ENCOUNTER — Encounter: Payer: Self-pay | Admitting: Family Medicine

## 2023-11-19 VITALS — BP 122/76 | HR 100 | Ht 63.0 in | Wt 259.0 lb

## 2023-11-19 DIAGNOSIS — R0683 Snoring: Secondary | ICD-10-CM

## 2023-11-19 DIAGNOSIS — G4730 Sleep apnea, unspecified: Secondary | ICD-10-CM

## 2023-11-19 DIAGNOSIS — G43709 Chronic migraine without aura, not intractable, without status migrainosus: Secondary | ICD-10-CM | POA: Diagnosis not present

## 2023-11-19 MED ORDER — PROPRANOLOL HCL 10 MG PO TABS: 10.0000 mg | ORAL_TABLET | Freq: Two times a day (BID) | ORAL | 1 refills | Status: DC | PRN

## 2023-11-19 NOTE — Assessment & Plan Note (Signed)
 Trial on Propranolol  10 mg Advise methods include deep breathing, cognitive behavioral therapy focusing on changing thoughts, Limit or avoid alcohol, caffeine, chocolate, canner foods, MSG and aspartame.  Implement sleep hygiene includes not watching TV or listen music in bed, don't eat heavy meals within a couple of hours of bedtime, don't use your phone, laptop, or tablet at bedtime

## 2023-11-19 NOTE — Assessment & Plan Note (Signed)
 Stop bang Score 3 points Referral placed to sleep studies

## 2023-12-05 ENCOUNTER — Encounter: Payer: Self-pay | Admitting: Gastroenterology

## 2023-12-19 ENCOUNTER — Other Ambulatory Visit: Payer: Self-pay | Admitting: Family Medicine

## 2023-12-19 DIAGNOSIS — N3281 Overactive bladder: Secondary | ICD-10-CM

## 2023-12-19 NOTE — Telephone Encounter (Signed)
 Per MAR refill available at pharmacy.

## 2023-12-19 NOTE — Telephone Encounter (Signed)
 Copied from CRM [id number]. Topic: Clinical - Medication Refill >> Dec 19, 2023  8:42 AM Renea ORN wrote: Most Recent Primary Care Visit:  Provider: TERRY WILHELMENA LLOYD HILARIO  Department: [facility]  Visit Type: ACUTE  Date: 11/19/2023  Medication: mirabegron  ER (MYRBETRIQ ) 25 MG TB24 tablet  Has the patient contacted their pharmacy? Yes (Agent: If no, request that the patient contact the pharmacy for the refill. If patient does not wish to contact the pharmacy document the reason why and proceed with request.) (Agent: If yes, when and what did the pharmacy advise?)  Is this the correct pharmacy for this prescription? Yes If no, delete pharmacy and type the correct one.  This is the patient's preferred pharmacy:   [id number] - [place], [place] - [address]  [place] [address] Phone: [phone] Fax: [phone]   Has the prescription been filled recently? Yes  Is the patient out of the medication? Yes  Has the patient been seen for an appointment in the last year OR does the patient have an upcoming appointment? Yes  Can we respond through MyChart? Yes  Agent: Please be advised that Rx refills may take up to 3 business days. We ask that you follow-up with your pharmacy.

## 2023-12-26 ENCOUNTER — Other Ambulatory Visit: Payer: Self-pay | Admitting: Family Medicine

## 2023-12-26 DIAGNOSIS — K219 Gastro-esophageal reflux disease without esophagitis: Secondary | ICD-10-CM

## 2023-12-26 NOTE — Telephone Encounter (Signed)
Copied from CRM [id number]. Topic: Clinical - Medication Refill >> Dec 26, 2023 12:27 PM Louie Casa B wrote: Most Recent Primary Care Visit:  Provider: Rica Records  Department: [facility]  Visit Type: ACUTE  Date: 11/19/2023  Medication: mirabegron ER (MYRBETRIQ) 25 MG   Has the patient contacted their pharmacy? Yes (Agent: If no, request that the patient contact the pharmacy for the refill. If patient does not wish to contact the pharmacy document the reason why and proceed with request.) (Agent: If yes, when and what did the pharmacy advise?)  Is this the correct pharmacy for this prescription? Yes If no, delete pharmacy and type the correct one.  This is the patient's preferred pharmacy:  [id number] - [place], [place] - [address] [place] [address] Phone: [phone] Fax: [phone]   Has the prescription been filled recently? Yes  Is the patient out of the medication? Yes  Has the patient been seen for an appointment in the last year OR does the patient have an upcoming appointment? Yes  Can we respond through MyChart? No  Agent: Please be advised that Rx refills may take up to 3 business days. We ask that you follow-up with your pharmacy.

## 2024-01-16 ENCOUNTER — Ambulatory Visit (INDEPENDENT_AMBULATORY_CARE_PROVIDER_SITE_OTHER): Payer: MEDICAID | Admitting: Primary Care

## 2024-01-16 ENCOUNTER — Encounter: Payer: Self-pay | Admitting: Primary Care

## 2024-01-16 VITALS — BP 102/62 | HR 87 | Ht 64.5 in | Wt 266.2 lb

## 2024-01-16 DIAGNOSIS — R0683 Snoring: Secondary | ICD-10-CM

## 2024-01-16 NOTE — Patient Instructions (Addendum)
 -SUSPECTED OBSTRUCTIVE SLEEP APNEA (OSA): Obstructive Sleep Apnea (OSA) is a condition where the airway becomes blocked during sleep, causing breathing pauses. We will arrange a home sleep study through [facility] to confirm the diagnosis. If OSA is confirmed, we will discuss CPAP therapy and positional therapy to help manage the condition. Weight loss was also advised as it can reduce the severity of OSA. -OBESITY: Obesity can increase the risk of OSA and is linked to weight gain from PCOS. We discussed the possibility of weight loss medications like Zepbound, which you can discuss further with your primary care physician if OSA is confirmed. Lifestyle modifications for weight loss were also encouraged.   Mild OSA 5-15 apneic events an hour Moderate OSA 15-30 apneic events an hour Severe OSA > 30 apneic events an hour   Untreated sleep apnea puts you at higher risk for cardiac arrhythmias, pulmonary HTN, stroke and diabetes   Treatment options include weight loss, side sleeping position, oral appliance, CPAP therapy or referral to ENT for possible surgical options    Recommendations: Focus on side sleeping position or elevate head with wedge pillow 30 degrees Work on weight loss efforts if able  Do not drive if experiencing excessive daytime sleepiness of fatigue    Orders: Home sleep study re: loud snoring    Follow-up: Please call to schedule follow-up 2-3 weeks after completing home sleep study to review results and treatment if needed (can be virtual)  Sleep Apnea  Sleep apnea is a condition that affects your breathing while you are sleeping. Your tongue or soft tissue in your throat may block the flow of air while you sleep. You may have shallow breathing or stop breathing for short periods of time. People with sleep apnea may snore loudly. There are three kinds of sleep apnea: Obstructive sleep apnea. This kind is caused by a blocked or collapsed airway. This is the most  common. Central sleep apnea. This kind happens when the part of the brain that controls breathing does not send the correct signals to the muscles that control breathing. Mixed sleep apnea. This is a combination of obstructive and central sleep apnea. What are the causes? The most common cause of sleep apnea is a collapsed or blocked airway. What increases the risk? Being very overweight. Having family members with sleep apnea. Having a tongue or tonsils that are larger than normal. Having a small airway or jaw problems. Being older. What are the signs or symptoms? Loud snoring. Restless sleep. Trouble staying asleep. Being sleepy or tired during the day. Waking up gasping or choking. Having a headache in the morning. Mood swings. Having a hard time remembering things and concentrating. How is this diagnosed? A medical history. A physical exam. A sleep study. This is also called a polysomnography test. This test is done at a sleep lab or in your home while you are sleeping. How is this treated? Treatment may include: Sleeping on your side. Losing weight if you're overweight. Wearing an oral appliance. This is a mouthpiece that moves your lower jaw forward. Using a positive airway pressure (PAP) device to keep your airways open while you sleep, such as: A continuous positive airway pressure (CPAP) device. This device gives forced air through a mask when you breathe out. This keeps your airways open. A bilevel positive airway pressure (BIPAP) device. This device gives forced air through a mask when you breathe in and when you breathe out to keep your airways open. Having surgery if other  treatments do not work. If your sleep apnea is not treated, you may be at risk for: Heart failure. Heart attack. Stroke. Type 2 diabetes or a problem with your blood sugar called insulin resistance. Follow these instructions at home: Medicines Take your medicines only as told by your health care  provider. Avoid alcohol, medicines to help you relax, and certain pain medicines. These may make sleep apnea worse. General instructions Do not smoke, vape, or use products with nicotine or tobacco in them. If you need help quitting, talk with your provider. If you were given a PAP device to open your airway while you sleep, use it as told by your provider. If you're having surgery, make sure to tell your provider you have sleep apnea. You may need to bring your PAP device with you. Contact a health care provider if: The PAP device that you were given to use during sleep bothers you or does not seem to be working. You do not feel better or you feel worse. Get help right away if: You have trouble breathing. You have chest pain. You have trouble talking. One side of your body feels weak. A part of your face is hanging down. These symptoms may be an emergency. Call 911 right away. Do not wait to see if the symptoms will go away. Do not drive yourself to the hospital. This information is not intended to replace advice given to you by your health care provider. Make sure you discuss any questions you have with your health care provider. Document Revised: 08/01/2023 Document Reviewed: 01/03/2023 Elsevier Patient Education  2024 [employer].

## 2024-01-16 NOTE — Progress Notes (Signed)
 @Patient  ID: Lisa Farrell, female    DOB: [birth year], 28 y.o.   MRN: [id number]  No chief complaint on file.   Referring provider: Gilmore Laroche, FNP  HPI: 28 year old female. PMH significant for chronic migraine, GERD, overactive bladder, marijuana abuse, depression.  01/16/2024 Discussed the use of AI scribe software for clinical note transcription with the patient, who gave verbal consent to proceed.  History of Present Illness   Lisa Farrell is a 28 year old female who presents with a sleep consult. She was referred by her gastroenterologist for evaluation of sleep apnea.  She experiences loud, disruptive snoring and wakes up feeling short of breath and gasping for air. During a previous colonoscopy, witnessed apnea was noted by the anesthesiologist. She has never undergone a sleep study and does not use a CPAP or oxygen.  Her typical bedtime is 10:30 PM, and she wakes up about three times a night, starting her day at 9 AM. She takes frequent naps during the day and experiences a constant feeling of tiredness. Her Epworth Sleepiness Scale score is 11, indicating a moderate level of daytime sleepiness. No alcohol consumption.  She experiences morning headaches, described as 'banging,' which have been severe enough to warrant a prescription for headache medication. Some days her head hurts so much that she does not want to do anything.  She has gained significant weight over the last two years, increasing from 180 to 270 pounds, which she attributes to a diagnosis of polycystic ovary syndrome (PCOS).      Allergies  Allergen Reactions   Ondansetron Hcl Rash    Immunization History  Administered Date(s) Administered   Tdap 05/10/2018    Past Medical History:  Diagnosis Date   BV (bacterial vaginosis) 05/21/2014   History of chlamydia    Patient desires pregnancy 01/27/2014   Vaginal discharge 01/27/2014    Tobacco History: Social History   Tobacco Use   Smoking Status Every Day   Current packs/day: 0.50   Types: Cigarettes   Passive exposure: Current  Smokeless Tobacco Never  Tobacco Comments   1/2 to 1 ppd   Ready to quit: Not Answered Counseling given: Not Answered Tobacco comments: 1/2 to 1 ppd   Outpatient Medications Prior to Visit  Medication Sig Dispense Refill   aspirin-acetaminophen-caffeine (EXCEDRIN EXTRA STRENGTH) 250-250-65 MG tablet Take 2 tablets by mouth every 6 (six) hours as needed.     ibuprofen (ADVIL) 600 MG tablet Take 1 tablet (600 mg total) by mouth every 6 (six) hours as needed. 30 tablet 0   linaclotide (LINZESS) 145 MCG CAPS capsule Take 1 capsule (145 mcg total) by mouth daily before breakfast. 90 capsule 3   mirabegron ER (MYRBETRIQ) 25 MG TB24 tablet Take 1 tablet (25 mg total) by mouth daily. 90 tablet 1   mirabegron ER (MYRBETRIQ) 25 MG TB24 tablet Take 1 tablet by mouth daily.     mupirocin ointment (BACTROBAN) 2 % Apply 1 Application topically 2 (two) times daily.     norethindrone-ethinyl estradiol-FE (LOESTRIN FE) 1-20 MG-MCG tablet Take 1 tablet by mouth daily.     pantoprazole (PROTONIX) 20 MG tablet Take 1 tablet by mouth daily.     pantoprazole (PROTONIX) 20 MG tablet TAKE 1 TABLET(20 MG) BY MOUTH DAILY 60 tablet 1   propranolol (INDERAL) 10 MG tablet Take 1 tablet (10 mg total) by mouth every 12 (twelve) hours as needed. 60 tablet 1   No facility-administered medications prior to visit.   Review of  Systems  Review of Systems  Constitutional: Negative.   Respiratory:  Positive for shortness of breath.   Neurological:  Positive for headaches.  Psychiatric/Behavioral:  Positive for sleep disturbance.    Physical Exam  There were no vitals taken for this visit. Physical Exam Constitutional:      General: She is not in acute distress.    Appearance: Normal appearance. She is obese. She is not ill-appearing.  HENT:     Head: Normocephalic and atraumatic.     Mouth/Throat:     Mouth:  Mucous membranes are moist.     Pharynx: Oropharynx is clear.  Cardiovascular:     Rate and Rhythm: Normal rate and regular rhythm.  Pulmonary:     Effort: Pulmonary effort is normal.     Breath sounds: Normal breath sounds.  Musculoskeletal:        General: Normal range of motion.  Skin:    General: Skin is warm and dry.  Neurological:     General: No focal deficit present.     Mental Status: She is alert and oriented to person, place, and time. Mental status is at baseline.  Psychiatric:        Mood and Affect: Mood normal.        Behavior: Behavior normal.        Thought Content: Thought content normal.        Judgment: Judgment normal.      Lab Results:  CBC    Component Value Date/Time   WBC 12.2 (H) 07/06/2023 1514   RBC 4.86 07/06/2023 1514   HGB 15.5 (H) 07/06/2023 1514   HCT 45.4 07/06/2023 1514   PLT 293 07/06/2023 1514   MCV 93.4 07/06/2023 1514   MCH 31.9 07/06/2023 1514   MCHC 34.1 07/06/2023 1514   RDW 12.5 07/06/2023 1514   LYMPHSABS 3.4 07/06/2023 1514   MONOABS 1.1 (H) 07/06/2023 1514   EOSABS 0.3 07/06/2023 1514   BASOSABS 0.1 07/06/2023 1514    BMET    Component Value Date/Time   NA 136 07/06/2023 1514   K 3.9 07/06/2023 1514   CL 105 07/06/2023 1514   CO2 22 07/06/2023 1514   GLUCOSE 113 (H) 07/06/2023 1514   BUN 8 07/06/2023 1514   CREATININE 0.70 07/06/2023 1514   CALCIUM 8.9 07/06/2023 1514   GFRNONAA >60 07/06/2023 1514   GFRAA >60 12/22/2017 1019    BNP No results found for: "BNP"  ProBNP No results found for: "PROBNP"  Imaging: No results found.   Assessment & Plan:   1. Loud snoring (Primary) - Home sleep test; Future     Suspected Obstructive Sleep Apnea (OSA) Symptoms and clinical presentation strongly suggest OSA. Patient reports symptoms of loud snoring, witnessed apnea and daytime sleepiness. Epworth score 11, indicates moderate daytime sleepiness.  - Order home sleep study through [facility]. - Educate  on sleep apnea pathophysiology, risks of untreated sleep apnea and treatment options. - Discuss potential CPAP therapy if moderate to severe OSA is confirmed. - Advise on positional therapy to reduce apneic events. - Discuss weight loss as an intervention to reduce OSA severity.  Obesity Obesity exacerbates OSA risk. Weight gain linked to PCOS.  - Advised patient discuss weight loss medications like Zepbound with primary care physician if OSA confirmed. - Encourage weight loss through lifestyle modifications.     Glenford Bayley, NP 01/16/2024

## 2024-01-22 ENCOUNTER — Other Ambulatory Visit: Payer: Self-pay | Admitting: Family Medicine

## 2024-01-22 DIAGNOSIS — G43709 Chronic migraine without aura, not intractable, without status migrainosus: Secondary | ICD-10-CM

## 2024-02-21 ENCOUNTER — Ambulatory Visit: Payer: MEDICAID

## 2024-02-21 DIAGNOSIS — R0683 Snoring: Secondary | ICD-10-CM

## 2024-03-04 ENCOUNTER — Telehealth: Payer: Self-pay

## 2024-03-04 NOTE — Telephone Encounter (Signed)
 Spoke to patient already. Nothing further needed.

## 2024-03-04 NOTE — Telephone Encounter (Signed)
 Copied from CRM [id number]. Topic: Clinical - Medical Advice >> Mar 03, 2024  8:31 AM Isabell A wrote: Reason for CRM: Patient is requesting to speak with Lynder Sanger in regard to when she can come in for her sleep study machine.   Callback number: [phone] After 12pm, OK to leave voicemail.

## 2024-03-05 ENCOUNTER — Ambulatory Visit: Payer: MEDICAID

## 2024-03-22 ENCOUNTER — Emergency Department (HOSPITAL_COMMUNITY)
Admission: EM | Admit: 2024-03-22 | Discharge: 2024-03-22 | Disposition: A | Discharge status: Home / Self Care | Payer: MEDICAID | Attending: Emergency Medicine | Admitting: Emergency Medicine

## 2024-03-22 ENCOUNTER — Encounter (HOSPITAL_COMMUNITY): Payer: Self-pay | Admitting: *Deleted

## 2024-03-22 ENCOUNTER — Emergency Department (HOSPITAL_COMMUNITY): Payer: MEDICAID

## 2024-03-22 ENCOUNTER — Other Ambulatory Visit: Payer: Self-pay

## 2024-03-22 DIAGNOSIS — X501XXA Overexertion from prolonged static or awkward postures, initial encounter: Secondary | ICD-10-CM | POA: Diagnosis not present

## 2024-03-22 DIAGNOSIS — Y9389 Activity, other specified: Secondary | ICD-10-CM | POA: Insufficient documentation

## 2024-03-22 DIAGNOSIS — S93401A Sprain of unspecified ligament of right ankle, initial encounter: Secondary | ICD-10-CM | POA: Diagnosis not present

## 2024-03-22 DIAGNOSIS — S99911A Unspecified injury of right ankle, initial encounter: Secondary | ICD-10-CM | POA: Diagnosis present

## 2024-03-22 DIAGNOSIS — M7989 Other specified soft tissue disorders: Secondary | ICD-10-CM | POA: Diagnosis not present

## 2024-03-22 DIAGNOSIS — Y92481 Parking lot as the place of occurrence of the external cause: Secondary | ICD-10-CM | POA: Insufficient documentation

## 2024-03-22 DIAGNOSIS — S93601A Unspecified sprain of right foot, initial encounter: Secondary | ICD-10-CM | POA: Insufficient documentation

## 2024-03-22 MED ORDER — KETOROLAC TROMETHAMINE 15 MG/ML IJ SOLN: 15.0000 mg | Freq: Once | INTRAMUSCULAR | Status: DC

## 2024-03-22 MED ORDER — NAPROXEN 500 MG PO TABS: 500.0000 mg | ORAL_TABLET | Freq: Two times a day (BID) | ORAL | 0 refills | Status: DC

## 2024-03-22 MED ORDER — KETOROLAC TROMETHAMINE 15 MG/ML IJ SOLN
15.0000 mg | Freq: Once | INTRAMUSCULAR | Status: AC
  Administered 2024-03-22: 15 mg via INTRAMUSCULAR
  Filled 2024-03-22: qty 1

## 2024-03-22 MED ORDER — ACETAMINOPHEN 500 MG PO TABS
1000.0000 mg | ORAL_TABLET | Freq: Once | ORAL | Status: AC
Start: 2024-03-22 — End: 2024-03-22
  Administered 2024-03-22: 1000 mg via ORAL
  Filled 2024-03-22: qty 2

## 2024-03-22 NOTE — Progress Notes (Signed)
 Discharge instructions explained. Boot applied, Crutches adjusted. Discharged with son.

## 2024-03-22 NOTE — ED Triage Notes (Signed)
 Pt stepped in a hole outside while taking some trash out, ankle twisted and pt fell.  Denies hitting her head.  C/o right foot and ankle pain, pain with putting her weight on that foot.

## 2024-03-22 NOTE — ED Provider Notes (Signed)
 [place] EMERGENCY DEPARTMENT AT [facility] Provider Note   CSN: [id number] Arrival date & time: 03/22/24  1236     History  Chief Complaint  Patient presents with   Ankle Pain    Lisa Farrell is a 28 y.o. female.  He has PMH of migraines, GERD, depression.  Presents to ER complaining of right foot and ankle pain today.  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  She is able to get herself up and unable to bear full weight but able to get back inside.  She is having significant pain and swelling to the dorsum of the foot and lateral ankle.  She states it hurts to move her toes, though she denies any numbness or tingling.  She denies color change to the foot.  This happened just prior to arrival.  Denies head injury, neck pain or other injuries.   Ankle Pain      Home Medications Prior to Admission medications   Medication Sig Start Date End Date Taking? Authorizing Provider  naproxen (NAPROSYN) 500 MG tablet Take 1 tablet (500 mg total) by mouth 2 (two) times daily. 03/22/24  Yes Elyjah Hazan A, PA-C  amoxicillin  (AMOXIL ) 875 MG tablet Take 875 mg by mouth 2 (two) times daily. 01/12/24   [provider]  aspirin-acetaminophen -caffeine (EXCEDRIN EXTRA STRENGTH) 250-250-65 MG tablet Take 2 tablets by mouth every 6 (six) hours as needed.    [provider]  ibuprofen  (ADVIL ) 600 MG tablet Take 1 tablet (600 mg total) by mouth every 6 (six) hours as needed. 07/06/23   Baxter Limber A, PA-C  linaclotide  (LINZESS ) 145 MCG CAPS capsule Take 1 capsule (145 mcg total) by mouth daily before breakfast. 10/16/23 10/15/24  Carver, Charles K, DO  metFORMIN (GLUCOPHAGE-XR) 500 MG 24 hr tablet Take 500 mg by mouth daily with breakfast. 01/02/24 02/05/25   [provider]  mirabegron  ER (MYRBETRIQ ) 25 MG TB24 tablet Take 1 tablet (25 mg total) by mouth daily. 09/18/23   Zarwolo, Gloria, FNP  mirabegron  ER (MYRBETRIQ ) 25 MG TB24 tablet Take 1 tablet by mouth daily. 09/18/23   [provider]  mupirocin ointment (BACTROBAN) 2 % Apply 1 Application topically 2 (two) times daily. 09/25/23   [provider]  norethindrone-ethinyl estradiol-FE (LOESTRIN FE) 1-20 MG-MCG tablet Take 1 tablet by mouth daily. 09/25/23 09/24/24  [provider]  pantoprazole  (PROTONIX ) 20 MG tablet Take 1 tablet by mouth daily. 09/03/23   [provider]  pantoprazole  (PROTONIX ) 20 MG tablet TAKE 1 TABLET(20 MG) BY MOUTH DAILY 12/26/23   Zarwolo, Gloria, FNP  propranolol  (INDERAL ) 10 MG tablet TAKE 1 TABLET(10 MG) BY MOUTH EVERY 12 HOURS AS NEEDED 01/22/24   Del Orbe Polanco, Iliana, FNP      Allergies    Ondansetron  hcl    Review of Systems   Review of Systems  Physical Exam Updated Vital Signs BP 119/64   Pulse 90   Temp 97.7 F (36.5 C) (Oral)   Resp 20   Ht 5' 4.5" (1.638 m)   Wt 120.7 kg   SpO2 99%   BMI 44.97 kg/m  Physical Exam Vitals and nursing note reviewed.  Constitutional:      General: She is not in acute  distress.    Appearance: She is well-developed.  HENT:     Head: Normocephalic and atraumatic.  Eyes:     Conjunctiva/sclera: Conjunctivae normal.  Cardiovascular:     Rate and Rhythm: Normal rate and regular rhythm.     Heart sounds: No murmur heard. Pulmonary:     Effort: Pulmonary effort is normal. No respiratory distress.     Breath sounds: Normal breath sounds.  Abdominal:     Palpations: Abdomen is soft.     Tenderness: There is no abdominal tenderness.  Musculoskeletal:        General: Swelling present.     Cervical back: Neck supple.     Comments: Swelling to the right lateral malleolus and dorsum of the right foot.  There is mild ecchymosis noted to the right lateral malleolus.  No  medial ankle tenderness.  Patient is able to dorsiflex and plantarflex, but not able to do this fully secondary to pain.  Thompson test is normal.  DP and PT pulses are intact.  Sensation intact.  Pain is worst at the lateral malleolus, across the dorsum of the foot over the midfoot and over the arch on the plantar aspect of her foot.  Skin:    General: Skin is warm and dry.     Capillary Refill: Capillary refill takes less than 2 seconds.  Neurological:     General: No focal deficit present.     Mental Status: She is alert and oriented to person, place, and time.  Psychiatric:        Mood and Affect: Mood normal.     ED Results / Procedures / Treatments   Labs (all labs ordered are listed, but only abnormal results are displayed) Labs Reviewed - No data to display  EKG None  Radiology DG Foot Complete Right Result Date: 03/22/2024 CLINICAL DATA:  Patient stepped in hole outside while taking out trash twisting ankle. EXAM: RIGHT FOOT COMPLETE - 3+ VIEW COMPARISON:  None Available. FINDINGS: There is no evidence of fracture or dislocation. There is no evidence of arthropathy or other focal bone abnormality. Soft tissues are unremarkable. IMPRESSION: Negative. Electronically Signed   By: Kimberley Penman M.D.   On: 03/22/2024 13:25   DG Ankle Complete Right Result Date: 03/22/2024 CLINICAL DATA:  Twisting injury to the ankle EXAM: RIGHT ANKLE - COMPLETE 3 VIEW COMPARISON:  None Available. FINDINGS: There are no findings of fracture or dislocation. No joint effusion. There is no evidence of arthropathy or other focal bone abnormality. Ankle mortise is intact. Soft tissue swelling over the lateral malleolus. IMPRESSION: Soft tissue swelling over the lateral malleolus without evidence of fracture or dislocation. Electronically Signed   By: Limin  Xu M.D.   On: 03/22/2024 13:24    Procedures Procedures    Medications Ordered in ED Medications  acetaminophen  (TYLENOL ) tablet 1,000 mg (has no  administration in time range)  ketorolac  (TORADOL ) 15 MG/ML injection 15 mg (has no administration in time range)    ED Course/ Medical Decision Making/ A&P                                 Medical Decision Making Differential diagnosis includes but not limited to fracture, sprain, dislocation, contusion, other  ED course: Patient presented with right foot and ankle pain after twisting and fall on the right ankle and foot.  She was able to bear some weight initially but having significant pain  and swelling at this time.  The foot is neurovascularly intact.  Given her degree of pain and swelling, in light of normal x-rays we will put her in a cam walker and have her use crutches, nonweightbearing and follow-up closely with orthopedics.  We discussed that likely has a sprain, given the tenderness over her midfoot and the twisting and falling mechanism, there is concern for possible Lisfranc sprain.  Without proper treatment these can lead to adverse outcomes such as arthritis, chronic pain and disability so will manage conservatively and have her follow-up with orthopedics.  Amount and/or Complexity of Data Reviewed Radiology: ordered.  Risk OTC drugs. Prescription drug management.           Final Clinical Impression(s) / ED Diagnoses Final diagnoses:  Sprain of right ankle, unspecified ligament, initial encounter  Sprain of right foot, initial encounter    Rx / DC Orders ED Discharge Orders          Ordered    naproxen (NAPROSYN) 500 MG tablet  2 times daily        03/22/24 [address] PA-C 03/22/24 1428    Cheyenne Cotta, MD 03/23/24 1820

## 2024-03-22 NOTE — Discharge Instructions (Addendum)
 It was a pleasure taking care of you today.  You were seen in the emergency room for evaluation of right foot and ankle pain after a fall.  Please wear the boot, use the crutches and follow-up with orthopedics.  Come back to the ER for new or worsening symptoms.  Fortunately your x-ray did not show any broken bones or dislocations.  You were prescribed naproxen to help with pain and swelling, you can also take over-the-counter Tylenol  as directed on the packaging.  Do not take ibuprofen , Aleve or aspirin with the naproxen you prescribed as they can cause stomach irritation or ulcers if taken together.

## 2024-03-25 ENCOUNTER — Encounter: Payer: Self-pay | Admitting: *Deleted

## 2024-03-30 ENCOUNTER — Telehealth: Payer: Self-pay | Admitting: Pulmonary Disease

## 2024-03-30 DIAGNOSIS — G4733 Obstructive sleep apnea (adult) (pediatric): Secondary | ICD-10-CM | POA: Diagnosis not present

## 2024-03-30 NOTE — Telephone Encounter (Signed)
 Call patient  Sleep study result  Date of study: 03/06/2024  Impression: Severe obstructive sleep apnea with severe oxygen desaturations  Recommendation: DME referral  Recommend CPAP therapy for severe obstructive sleep apnea  Auto titrating CPAP with pressure settings of 5-20 will be appropriate  Encourage weight loss measures  Follow-up in the office 4 to 6 weeks following initiation of treatment

## 2024-03-31 NOTE — Telephone Encounter (Signed)
 Please let patient know SLEEP study on 03/06/24 showed severe OSA with severe oxygen desaturations. Recommend patient be started on CPAP, if patient in agreement please place order for auto CPAP 5-20cm with mask of choice. She will need follow-up in 6-8 weeks for compliance check in. Advised she aim to wear CPAP nightly 4-6 hours or longer

## 2024-04-01 NOTE — Telephone Encounter (Signed)
 I called and spoke to pt. Pt informed of Beth's note and verbalized understanding. CPAP order placed. NFN

## 2024-06-29 ENCOUNTER — Encounter: Payer: MEDICAID | Admitting: Primary Care

## 2024-06-29 ENCOUNTER — Telehealth: Payer: Self-pay

## 2024-06-29 DIAGNOSIS — G4733 Obstructive sleep apnea (adult) (pediatric): Secondary | ICD-10-CM

## 2024-06-29 NOTE — Telephone Encounter (Signed)
 I called and spoke to pt. Pt will need to reschedule her virtual appt with Almarie Ferrari, NP from today (06-29-24) according to the pt who is working and states her phone was about to die, and she has not been consistently using her CPAP. Routing to the front desk to schedule with Almarie Ferrari, NP in 4 weeks. Can be a Friday afternoon and can be virtual. Pt was advised to be more consistent with use and pt verbalized understanding.

## 2024-06-29 NOTE — Progress Notes (Deleted)
 Virtual Visit via Video Note  I connected with Laymon LOISE Ellen on 06/29/24 at  2:00 PM EDT by a video enabled telemedicine application and verified that I am speaking with the correct person using two identifiers.  Location: Patient: *** Provider: ***   I discussed the limitations of evaluation and management by telemedicine and the availability of in person appointments. The patient expressed understanding and agreed to proceed.  History of Present Illness:   28 year old female. PMH significant for chronic migraine, GERD, overactive bladder, marijuana abuse, depression.  Previous [facility] pulmonary encounter: 01/16/2024 Discussed the use of AI scribe software for clinical note transcription with the patient, who gave verbal consent to proceed.  History of Present Illness   JAELYNN POZO is a 28 year old female who presents with a sleep consult. She was referred by her gastroenterologist for evaluation of sleep apnea.  She experiences loud, disruptive snoring and wakes up feeling short of breath and gasping for air. During a previous colonoscopy, witnessed apnea was noted by the anesthesiologist. She has never undergone a sleep study and does not use a CPAP or oxygen.  Her typical bedtime is 10:30 PM, and she wakes up about three times a night, starting her day at 9 AM. She takes frequent naps during the day and experiences a constant feeling of tiredness. Her Epworth Sleepiness Scale score is 11, indicating a moderate level of daytime sleepiness. No alcohol consumption.  She experiences morning headaches, described as 'banging,' which have been severe enough to warrant a prescription for headache medication. Some days her head hurts so much that she does not want to do anything.  She has gained significant weight over the last two years, increasing from 180 to 270 pounds, which she attributes to a diagnosis of polycystic ovary syndrome (PCOS).      06/29/2024- Interim hx  Discussed  the use of AI scribe software for clinical note transcription with the patient, who gave verbal consent to proceed.  History of Present Illness   HST on 03/06/24 showed severe OSA, AHI 69.3/hour with severe oxygen desaturations. Recommend patient be started on CPAP, if patient in agreement please place order for auto CPAP 5-20cm with mask of choice. She will need follow-up in 6-8 weeks for compliance check in       Observations/Objective:   Assessment and Plan:   Follow Up Instructions:    I discussed the assessment and treatment plan with the patient. The patient was provided an opportunity to ask questions and all were answered. The patient agreed with the plan and demonstrated an understanding of the instructions.   The patient was advised to call back or seek an in-person evaluation if the symptoms worsen or if the condition fails to improve as anticipated.  I provided *** minutes of non-face-to-face time during this encounter.   Almarie LELON Ferrari, NP

## 2024-07-03 NOTE — Progress Notes (Signed)
 This encounter was created in error - please disregard.

## 2024-07-21 ENCOUNTER — Encounter: Payer: Self-pay | Admitting: Primary Care

## 2024-07-30 ENCOUNTER — Telehealth: Payer: Self-pay

## 2024-07-30 NOTE — Telephone Encounter (Signed)
 I called and spoke to pt. Pt states she is using her CPAP and continuing treatment. I will call her DME company to see if they can provide a download.   ATC Brad and Mitch with [employer]. Unable to reach either so I did lvm.

## 2024-07-31 ENCOUNTER — Telehealth (INDEPENDENT_AMBULATORY_CARE_PROVIDER_SITE_OTHER): Payer: MEDICAID | Admitting: Primary Care

## 2024-07-31 ENCOUNTER — Encounter: Payer: Self-pay | Admitting: Primary Care

## 2024-07-31 ENCOUNTER — Ambulatory Visit: Payer: MEDICAID | Admitting: Primary Care

## 2024-07-31 DIAGNOSIS — G4733 Obstructive sleep apnea (adult) (pediatric): Secondary | ICD-10-CM | POA: Diagnosis not present

## 2024-07-31 DIAGNOSIS — E669 Obesity, unspecified: Secondary | ICD-10-CM

## 2024-07-31 NOTE — Patient Instructions (Signed)
  VISIT SUMMARY: You came in today to review the results of your recent sleep study. We discussed your history of loud snoring, disrupted sleep, morning headaches, and recent weight loss. We also reviewed your current medications and the issues you are experiencing with your CPAP machine.  YOUR PLAN: -SEVERE OBSTRUCTIVE SLEEP APNEA WITH NOCTURNAL HYPOXEMIA: Severe obstructive sleep apnea is a condition where your breathing repeatedly stops and starts during sleep, leading to low oxygen levels. Your sleep study showed 69 pauses in breathing per hour with oxygen levels dropping to 75%. We will order a CPAP titration study to find the best pressure settings and mask fit for you. You will also be enrolled in Airview to monitor your CPAP usage. It is important to use your CPAP every night to help reduce your symptoms.  -OBESITY, IMPROVED WITH RECENT WEIGHT LOSS: Obesity is a condition where you have an excessive amount of body fat. You have recently lost 25 pounds due to increased physical activity from your new job. Continue with your current lifestyle changes. If your weight loss slows down or stops, we may discuss using a medication called GLP-1 to help.  -HEADACHES, IMPROVED WITH CPAP AND PROPRANOLOL : Your headaches have been getting better with the use of your CPAP machine and propranolol . Continue using both as prescribed to help manage your headaches.  INSTRUCTIONS: We will schedule a CPAP titration study for you. Please make sure to use your CPAP machine every night. You will be enrolled in Airview to help us  monitor your CPAP usage. Continue with your current lifestyle changes for weight loss, and we will discuss further options if needed.

## 2024-07-31 NOTE — Progress Notes (Signed)
 Virtual Visit via Video Note  I connected with Lisa Farrell on 07/31/24 at  2:00 PM EDT by a video enabled telemedicine application and verified that I am speaking with the correct person using two identifiers.  Location: Patient: Home Provider: Office   I discussed the limitations of evaluation and management by telemedicine and the availability of in person appointments. The patient expressed understanding and agreed to proceed.  History of Present Illness: 28 year old female. PMH significant for chronic migraine, GERD, overactive bladder, marijuana abuse, depression.  Previous [facility] pulmonary encounter:  01/16/2024 Discussed the use of AI scribe software for clinical note transcription with the patient, who gave verbal consent to proceed.  History of Present Illness   Lisa Farrell is a 28 year old female who presents with a sleep consult. She was referred by her gastroenterologist for evaluation of sleep apnea.  She experiences loud, disruptive snoring and wakes up feeling short of breath and gasping for air. During a previous colonoscopy, witnessed apnea was noted by the anesthesiologist. She has never undergone a sleep study and does not use a CPAP or oxygen.  Her typical bedtime is 10:30 PM, and she wakes up about three times a night, starting her day at 9 AM. She takes frequent naps during the day and experiences a constant feeling of tiredness. Her Epworth Sleepiness Scale score is 11, indicating a moderate level of daytime sleepiness. No alcohol consumption.  She experiences morning headaches, described as 'banging,' which have been severe enough to warrant a prescription for headache medication. Some days her head hurts so much that she does not want to do anything.  She has gained significant weight over the last two years, increasing from 180 to 270 pounds, which she attributes to a diagnosis of polycystic ovary syndrome (PCOS).    1. Loud snoring (Primary) - Home  sleep test; Future     Suspected Obstructive Sleep Apnea (OSA) Symptoms and clinical presentation strongly suggest OSA. Patient reports symptoms of loud snoring, witnessed apnea and daytime sleepiness. Epworth score 11, indicates moderate daytime sleepiness.  - Order home sleep study through [facility]. - Educate on sleep apnea pathophysiology, risks of untreated sleep apnea and treatment options. - Discuss potential CPAP therapy if moderate to severe OSA is confirmed. - Advise on positional therapy to reduce apneic events. - Discuss weight loss as an intervention to reduce OSA severity.  Obesity Obesity exacerbates OSA risk. Weight gain linked to PCOS.  - Advised patient discuss weight loss medications like Zepbound with primary care physician if OSA confirmed. - Encourage weight loss through lifestyle modifications.     07/31/2024- Interim hx  Discussed the use of AI scribe software for clinical note transcription with the patient, who gave verbal consent to proceed.  History of Present Illness Lisa Farrell is a 28 year old female with severe obstructive sleep apnea who presents for review of her sleep study results.  She has a history of loud snoring, disrupted sleep, waking up feeling short of breath, and gasping for air. During a previous colonoscopy, witnessed apnea was noted by the anesthesiologist. She experiences morning headaches and has gained weight over the last two years. Home sleep study in April showed severe OSA with hypoxemia, patient had an average of 69 pauses in breathing per hour, with oxygen levels dropping as low as 75%.  She has been using a auto CPAP machine for the past three to four months, which she obtained from a medical supply store in [place].  Current pressure is set on auto settings 5-20cm h20. Patient reports that CPAP has been beneficial, particularly in reducing her headaches, although she experiences issues with the mask fit, which  sometimes causes air leaks and discomfort. She uses the CPAP inconsistently due to these issues.   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  Airview download 07/01/24-07/30/24 Usage days 4/30 (13%) Pressure 5-20cm h20 (17cm h20-95%) Airleaks 19.2L/min AHI 2.6   Observations/Objective:  Appears well without overt respiratory symptoms  Assessment and Plan:  1. Severe obstructive sleep apnea (Primary) - Cpap titration; Future  Assessment & Plan Severe obstructive sleep apnea with nocturnal hypoxemia Severe obstructive sleep apnea confirmed by sleep study, with 69 apneic episodes per hour and significant oxygen desaturation (lowest at 75%). Current CPAP use is inconsistent due to mask fit issues causing air leakage and discomfort. CPAP has been beneficial when used, reducing morning headaches. - Order CPAP titration study to determine optimal pressure settings and mask fit - Enroll in Airview to monitor CPAP usage - Instruct to use CPAP every night 4-6 hours or longer   Obesity, improved with recent weight loss Obesity with recent improvement due to increased physical activity from new job, resulting in a 25-pound weight loss. - Continue current lifestyle modifications for weight loss - Discuss potential use of GLP-1 medication if weight loss plateaus or becomes an issue  Headaches, improved with CPAP and propranolol  Headaches have improved with the use of CPAP and propranolol . CPAP use has reduced the frequency of morning headaches.   Follow Up Instructions:  3 months with Bet NP or sooner if needed   I discussed the assessment and treatment plan with the patient. The patient was provided an opportunity to ask questions and all were answered. The patient agreed with the plan and demonstrated an understanding of the instructions.   The patient was  advised to call back or seek an in-person evaluation if the symptoms worsen or if the condition fails to improve as anticipated.  I provided 25 minutes of non-face-to-face time during this encounter.   Lisa LELON Ferrari, NP

## 2024-09-03 NOTE — Telephone Encounter (Signed)
 nfn

## 2024-09-20 ENCOUNTER — Emergency Department (HOSPITAL_COMMUNITY)
Admission: EM | Admit: 2024-09-20 | Discharge: 2024-09-21 | Disposition: A | Discharge status: Home / Self Care | Payer: MEDICAID | Attending: Emergency Medicine | Admitting: Emergency Medicine

## 2024-09-20 DIAGNOSIS — B370 Candidal stomatitis: Secondary | ICD-10-CM | POA: Insufficient documentation

## 2024-09-20 DIAGNOSIS — B3731 Acute candidiasis of vulva and vagina: Secondary | ICD-10-CM | POA: Insufficient documentation

## 2024-09-20 DIAGNOSIS — R739 Hyperglycemia, unspecified: Secondary | ICD-10-CM

## 2024-09-20 DIAGNOSIS — E1165 Type 2 diabetes mellitus with hyperglycemia: Secondary | ICD-10-CM | POA: Diagnosis not present

## 2024-09-20 DIAGNOSIS — R350 Frequency of micturition: Secondary | ICD-10-CM | POA: Diagnosis present

## 2024-09-20 LAB — BLOOD GAS, VENOUS
Acid-Base Excess: 3.5 mmol/L — ABNORMAL HIGH (ref 0.0–2.0)
Bicarbonate: 27.8 mmol/L (ref 20.0–28.0)
Drawn by: 34079
O2 Saturation: 88.6 %
Patient temperature: 37.7
pCO2, Ven: 41 mmHg — ABNORMAL LOW (ref 44–60)
pH, Ven: 7.44 — ABNORMAL HIGH (ref 7.25–7.43)
pO2, Ven: 56 mmHg — ABNORMAL HIGH (ref 32–45)

## 2024-09-20 LAB — CBG MONITORING, ED
Glucose-Capillary: 419 mg/dL — ABNORMAL HIGH (ref 70–99)
Glucose-Capillary: 446 mg/dL — ABNORMAL HIGH (ref 70–99)

## 2024-09-20 LAB — COMPREHENSIVE METABOLIC PANEL WITH GFR
ALT: 48 U/L — ABNORMAL HIGH (ref 0–44)
AST: 27 U/L (ref 15–41)
Albumin: 4.1 g/dL (ref 3.5–5.0)
Alkaline Phosphatase: 166 U/L — ABNORMAL HIGH (ref 38–126)
Anion gap: 13 (ref 5–15)
BUN: 15 mg/dL (ref 6–20)
CO2: 24 mmol/L (ref 22–32)
Calcium: 9.4 mg/dL (ref 8.9–10.3)
Chloride: 94 mmol/L — ABNORMAL LOW (ref 98–111)
Creatinine, Ser: 0.88 mg/dL (ref 0.44–1.00)
GFR, Estimated: >60 mL/min (ref >60)
Glucose, Bld: 419 mg/dL — ABNORMAL HIGH (ref 70–99)
Potassium: 4.3 mmol/L (ref 3.5–5.1)
Sodium: 131 mmol/L — ABNORMAL LOW (ref 135–145)
Total Bilirubin: 0.2 mg/dL (ref 0.0–1.2)
Total Protein: 7.2 g/dL (ref 6.5–8.1)

## 2024-09-20 LAB — WET PREP, GENITAL
Clue Cells Wet Prep HPF POC: NONE SEEN
Sperm: NONE SEEN
Trich, Wet Prep: NONE SEEN
WBC, Wet Prep HPF POC: <10
Yeast Wet Prep HPF POC: NONE SEEN

## 2024-09-20 LAB — CBC
HCT: 44.8 % (ref 36.0–46.0)
Hemoglobin: 14.8 g/dL (ref 12.0–15.0)
MCH: 31.2 pg (ref 26.0–34.0)
MCHC: 33 g/dL (ref 30.0–36.0)
MCV: 94.3 fL (ref 80.0–100.0)
Platelets: 305 K/uL (ref 150–400)
RBC: 4.75 MIL/uL (ref 3.87–5.11)
RDW: 13 % (ref 11.5–15.5)
WBC: 17.3 K/uL — ABNORMAL HIGH (ref 4.0–10.5)
nRBC: 0 % (ref 0.0–0.2)

## 2024-09-20 LAB — URINALYSIS, ROUTINE W REFLEX MICROSCOPIC
Bacteria, UA: NONE SEEN
Bilirubin Urine: NEGATIVE
Glucose, UA: >=500 mg/dL — AB
Ketones, ur: NEGATIVE mg/dL
Leukocytes,Ua: NEGATIVE
Nitrite: NEGATIVE
Protein, ur: NEGATIVE mg/dL
Specific Gravity, Urine: 1.031 — ABNORMAL HIGH (ref 1.005–1.030)
pH: 6 (ref 5.0–8.0)

## 2024-09-20 LAB — PREGNANCY, URINE: Preg Test, Ur: NEGATIVE

## 2024-09-20 LAB — BETA-HYDROXYBUTYRIC ACID: Beta-Hydroxybutyric Acid: 0.14 mmol/L (ref 0.05–0.27)

## 2024-09-20 MED ORDER — METFORMIN HCL 500 MG PO TABS: 500.0000 mg | ORAL_TABLET | Freq: Two times a day (BID) | ORAL | 2 refills | Status: DC

## 2024-09-20 MED ORDER — INSULIN ASPART 100 UNIT/ML IJ SOLN: 0.0000 [IU] | Freq: Three times a day (TID) | INTRAMUSCULAR | Status: DC

## 2024-09-20 MED ORDER — FLUCONAZOLE 100 MG PO TABS: 100.0000 mg | ORAL_TABLET | Freq: Every day | ORAL | 0 refills | Status: AC

## 2024-09-20 MED ORDER — METFORMIN HCL 500 MG PO TABS
500.0000 mg | ORAL_TABLET | Freq: Once | ORAL | Status: AC
  Administered 2024-09-20: 500 mg via ORAL
  Filled 2024-09-20: qty 1

## 2024-09-20 MED ORDER — FLUCONAZOLE 100 MG PO TABS
200.0000 mg | ORAL_TABLET | Freq: Once | ORAL | Status: AC
  Administered 2024-09-20: 200 mg via ORAL
  Filled 2024-09-20: qty 2

## 2024-09-20 MED ORDER — INSULIN ASPART 100 UNIT/ML IJ SOLN
0.0000 [IU] | Freq: Three times a day (TID) | INTRAMUSCULAR | Status: DC
  Administered 2024-09-20: 9 [IU] via SUBCUTANEOUS
  Filled 2024-09-20: qty 1

## 2024-09-20 NOTE — ED Provider Notes (Signed)
 Lisa Farrell Provider Note   CSN: [id number] Arrival date & time: 09/20/24  1940     History {Add pertinent medical, surgical, social history, OB history to HPI:1} Chief Complaint  Patient presents with   Urinary Frequency   Hyperglycemia    Lisa Farrell is a 28 y.o. female with PMH as listed below who presents with hyperglycemia and polydipsia, polyuria.   Thought she had a vaginal yeast infection as well as white spots in the mouth last week and took vaginal monistat, and her symptoms. Then developed the symptoms again. She has white coating on top of tongue and white spots on both sides of tongue that are painful. Also painful to swallow. Also has recurrent vaginal irritation/itching and increased white discharge. Also reports increased thirst and insatiable thirst as well as polyuria. Is having painful urination but relates it to yeast infection/vulvar irritation. No abdominal pain, nausea/vomiting. No chest pain, SOB, cough. Has been told by her OB/Gyn that she likely has insulin resistance and PCOS, had been prescribed 500 mg metformin every day but stopped taking it.    Past Medical History:  Diagnosis Date   BV (bacterial vaginosis) 05/21/2014   History of chlamydia    Patient desires pregnancy 01/27/2014   Vaginal discharge 01/27/2014       Home Medications Prior to Admission medications   Medication Sig Start Date End Date Taking? Authorizing Provider  fluconazole  (DIFLUCAN ) 100 MG tablet Take 1 tablet (100 mg total) by mouth daily for 14 days. 09/21/24 10/05/24 Yes Franklyn Sid LOISE, MD  metFORMIN (GLUCOPHAGE) 500 MG tablet Take 1 tablet (500 mg total) by mouth 2 (two) times daily with a meal. 09/20/24 10/20/24 Yes Franklyn Sid LOISE, MD  amoxicillin  (AMOXIL ) 875 MG tablet Take 875 mg by mouth 2 (two) times daily. 01/12/24   [provider]  aspirin-acetaminophen -caffeine (EXCEDRIN EXTRA STRENGTH) 250-250-65 MG tablet  Take 2 tablets by mouth every 6 (six) hours as needed.    [provider]  ibuprofen  (ADVIL ) 600 MG tablet Take 1 tablet (600 mg total) by mouth every 6 (six) hours as needed. 07/06/23   Suellen Cantor A, PA-C  linaclotide  (LINZESS ) 145 MCG CAPS capsule Take 1 capsule (145 mcg total) by mouth daily before breakfast. 10/16/23 10/15/24  Cindie Carlin POUR, DO  mirabegron  ER (MYRBETRIQ ) 25 MG TB24 tablet Take 1 tablet (25 mg total) by mouth daily. 09/18/23   Bacchus, Meade PEDLAR, FNP  mirabegron  ER (MYRBETRIQ ) 25 MG TB24 tablet Take 1 tablet by mouth daily. 09/18/23   [provider]  mupirocin ointment (BACTROBAN) 2 % Apply 1 Application topically 2 (two) times daily. 09/25/23   [provider]  naproxen  (NAPROSYN ) 500 MG tablet Take 1 tablet (500 mg total) by mouth 2 (two) times daily. 03/22/24   Suellen Cantor A, PA-C  norethindrone-ethinyl estradiol-FE (LOESTRIN FE) 1-20 MG-MCG tablet Take 1 tablet by mouth daily. 09/25/23 09/24/24  [provider]  pantoprazole  (PROTONIX ) 20 MG tablet Take 1 tablet by mouth daily. 09/03/23   [provider]  pantoprazole  (PROTONIX ) 20 MG tablet TAKE 1 TABLET(20 MG) BY MOUTH DAILY 12/26/23   Bacchus, Gloria Z, FNP  propranolol  (INDERAL ) 10 MG tablet TAKE 1 TABLET(10 MG) BY MOUTH EVERY 12 HOURS AS NEEDED 01/22/24   Del Orbe Polanco, Iliana, FNP      Allergies    Ondansetron  hcl    Review of Systems   Review of Systems A 10 point review of systems was  performed and is negative unless otherwise reported in HPI.  Physical Exam Updated Vital Signs BP 96/60   Pulse 98   Temp 99.8 F (37.7 C) (Oral)   Resp 18   Ht 5' 4 (1.626 m)   Wt 127.9 kg   LMP 08/22/2024   SpO2 96%   BMI 48.41 kg/m  Physical Exam General: Normal appearing {Desc; female/female:11659}, lying in bed.  HEENT: PERRLA, Sclera anicteric, MMM, trachea midline.  Cardiology: RRR, no murmurs/rubs/gallops. BL radial and DP pulses equal bilaterally.  Resp:  Normal respiratory rate and effort. CTAB, no wheezes, rhonchi, crackles.  Abd: Soft, non-tender, non-distended. No rebound tenderness or guarding.  GU: Deferred. MSK: No peripheral edema or signs of trauma. Extremities without deformity or TTP. No cyanosis or clubbing. Skin: warm, dry. No rashes or lesions. Back: No CVA tenderness Neuro: A&Ox4, CNs II-XII grossly intact. MAEs. Sensation grossly intact.  Psych: Normal mood and affect.   ED Results / Procedures / Treatments   Labs (all labs ordered are listed, but only abnormal results are displayed) Labs Reviewed  CBC - Abnormal; Notable for the following components:      Result Value   WBC 17.3 (*)    All other components within normal limits  URINALYSIS, ROUTINE W REFLEX MICROSCOPIC - Abnormal; Notable for the following components:   Color, Urine STRAW (*)    Specific Gravity, Urine 1.031 (*)    Glucose, UA >=500 (*)    Hgb urine dipstick SMALL (*)    All other components within normal limits  COMPREHENSIVE METABOLIC PANEL WITH GFR - Abnormal; Notable for the following components:   Sodium 131 (*)    Chloride 94 (*)    Glucose, Bld 419 (*)    ALT 48 (*)    Alkaline Phosphatase 166 (*)    All other components within normal limits  BLOOD GAS, VENOUS - Abnormal; Notable for the following components:   pH, Ven 7.44 (*)    pCO2, Ven 41 (*)    pO2, Ven 56 (*)    Acid-Base Excess 3.5 (*)    All other components within normal limits  CBG MONITORING, ED - Abnormal; Notable for the following components:   Glucose-Capillary 419 (*)    All other components within normal limits  CBG MONITORING, ED - Abnormal; Notable for the following components:   Glucose-Capillary 446 (*)    All other components within normal limits  CBG MONITORING, ED - Abnormal; Notable for the following components:   Glucose-Capillary 396 (*)    All other components within normal limits  WET PREP, GENITAL  BETA-HYDROXYBUTYRIC ACID  PREGNANCY, URINE   HEMOGLOBIN A1C  CBG MONITORING, ED  GC/CHLAMYDIA PROBE AMP ([place]) NOT AT [facility]    EKG None  Radiology No results found.  Procedures Procedures  {Document cardiac monitor, telemetry assessment procedure when appropriate:1}  Medications Ordered in ED Medications  insulin aspart (novoLOG) injection 0-9 Units (9 Units Subcutaneous Given 09/20/24 2315)  lactated ringers  bolus 2,000 mL (has no administration in time range)  metFORMIN (GLUCOPHAGE) tablet 500 mg (500 mg Oral Given 09/20/24 2314)  fluconazole  (DIFLUCAN ) tablet 200 mg (200 mg Oral Given 09/20/24 2314)    ED Course/ Medical Decision Making/ A&P                          Medical Decision Making Amount and/or Complexity of Data Reviewed Labs: ordered. Decision-making details documented in ED Course.  Risk Prescription drug management.  This patient presents to the ED for concern of ***, this involves an extensive number of treatment options, and is a complaint that carries with it a high risk of complications and morbidity.  I considered the following differential and admission for this acute, potentially life threatening condition.   MDM:    ***  Clinical Course as of 09/21/24 0006  Sun Sep 20, 2024  2257 Will treat with Diflucan  200 mg dose here and then 100 mg once daily for 2 weeks to treat both vulvovaginal candidiasis as well as oral thrush.  Also will prescribe 5 to milligrams metformin twice per day.  Patient is instructed to follow-up with her PCP within 1 week to discuss further treatment of her type 2 diabetes and obesity.  I discussed this at length with the patient.  She states she has many family members of diabetes and who have suffered amputations and so she is familiar with the consequences.  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  She will discuss with the physician about weight loss and type  2 diabetes management. [HN]  2309 Beta-Hydroxybutyric Acid: 0.14 neg [HN]    Clinical Course User Index [HN] Franklyn Sid SAILOR, MD    Labs: I Ordered, and personally interpreted labs.  The pertinent results include:  ***  Imaging Studies ordered: I ordered imaging studies including *** I independently visualized and interpreted imaging. I agree with the radiologist interpretation  Additional history obtained from ***.  External records from outside source obtained and reviewed including ***  Cardiac Monitoring: The patient was maintained on a cardiac monitor.  I personally viewed and interpreted the cardiac monitored which showed an underlying rhythm of: ***  Reevaluation: After the interventions noted above, I reevaluated the patient and found that they have :{resolved/improved/worsened:23923::improved}  Social Determinants of Health: ***  Disposition:  ***  Co morbidities that complicate the patient evaluation  Past Medical History:  Diagnosis Date   BV (bacterial vaginosis) 05/21/2014   History of chlamydia    Patient desires pregnancy 01/27/2014   Vaginal discharge 01/27/2014     Medicines Meds ordered this encounter  Medications   DISCONTD: insulin aspart (novoLOG) injection 0-9 Units    Correction coverage::   Sensitive (thin, NPO, renal)    CBG < 70::   Implement Hypoglycemia Standing Orders and refer to Hypoglycemia Standing Orders sidebar report    CBG 70 - 120::   0 units    CBG 121 - 150::   1 unit    CBG 151 - 200::   2 units    CBG 201 - 250::   3 units    CBG 251 - 300::   5 units    CBG 301 - 350::   7 units    CBG 351 - 400:   9 units    CBG > 400:   call MD and obtain STAT lab verification   metFORMIN (GLUCOPHAGE) tablet 500 mg   metFORMIN (GLUCOPHAGE) 500 MG tablet    Sig: Take 1 tablet (500 mg total) by mouth 2 (two) times daily with a meal.    Dispense:  60 tablet    Refill:  2   insulin aspart (novoLOG) injection 0-9 Units    Correction  coverage::   Sensitive (thin, NPO, renal)    CBG < 70::   Implement Hypoglycemia Standing Orders and refer to Hypoglycemia Standing  Orders sidebar report    CBG 70 - 120::   0 units    CBG 121 - 150::   1 unit    CBG 151 - 200::   2 units    CBG 201 - 250::   3 units    CBG 251 - 300::   5 units    CBG 301 - 350::   7 units    CBG 351 - 400:   9 units    CBG > 400:   call MD and obtain STAT lab verification   fluconazole  (DIFLUCAN ) tablet 200 mg   fluconazole  (DIFLUCAN ) 100 MG tablet    Sig: Take 1 tablet (100 mg total) by mouth daily for 14 days.    Dispense:  14 tablet    Refill:  0   lactated ringers  bolus 2,000 mL    I have reviewed the patients home medicines and have made adjustments as needed  Problem List / ED Course: Problem List Items Addressed This Visit   None Visit Diagnoses       Acute hyperglycemia    -  Primary     Oral thrush       Relevant Medications   fluconazole  (DIFLUCAN ) tablet 200 mg (Completed)   fluconazole  (DIFLUCAN ) 100 MG tablet     Vulvovaginal candidiasis       Relevant Medications   fluconazole  (DIFLUCAN ) tablet 200 mg (Completed)   fluconazole  (DIFLUCAN ) 100 MG tablet            {Document critical care time when appropriate:1} {Document review of labs and clinical decision tools ie heart score, Chads2Vasc2 etc:1}  {Document your independent review of radiology images, and any outside records:1} {Document your discussion with family members, caretakers, and with consultants:1} {Document social determinants of health affecting pt's care:1} {Document your decision making why or why not admission, treatments were needed:1}  This note was created using dictation software, which may contain spelling or grammatical errors.

## 2024-09-20 NOTE — ED Triage Notes (Signed)
 Pt had vaginal discharge last week and took OTC monistat with no relief. Now has urinary frequency, sore tongue, white patches on tongue, increased thirst and odor to breath

## 2024-09-20 NOTE — Discharge Instructions (Addendum)
 Thank you for coming to [facility] Emergency Department. You have type II diabetes, and subsequently a vulvovaginal candidiasis (yeast infection) and oral thrush ( yeast in the mouth) as a result of this. We gave you insulin in the ED to lower your blood sugar and the first dose of your diflucan .   Please take: -Metformin 500 mg twice per day -Diflucan  100 mg once daily for two weeks  He will need to follow-up with your primary care physician within 1 week to further discuss treatment for your diabetes.  It is possible that you will need to start checking her blood sugar at home or began taking other medication and/or insulin. Please follow up with your primary care provider within 1 week.  You can also follow-up with your gynecologist about your possible diagnosis of PCOS as well.  Do not hesitate to return to the ED or call 911 if you experience: -Worsening symptoms -Nausea vomiting so severe that you cannot eat or drink anything, abdominal pain -Shortness of breath, chest pain -Abdominal pain, worsening burning with urination, flank pain/back pain -Lightheadedness, passing out -Fevers/chills -Anything else that concerns you

## 2024-09-21 ENCOUNTER — Telehealth: Payer: Self-pay

## 2024-09-21 LAB — CBG MONITORING, ED
Glucose-Capillary: 304 mg/dL — ABNORMAL HIGH (ref 70–99)
Glucose-Capillary: 396 mg/dL — ABNORMAL HIGH (ref 70–99)

## 2024-09-21 LAB — HEMOGLOBIN A1C
Hgb A1c MFr Bld: 10.1 % — ABNORMAL HIGH (ref 4.8–5.6)
Mean Plasma Glucose: 243.17 mg/dL

## 2024-09-21 MED ORDER — LACTATED RINGERS IV BOLUS
2000.0000 mL | Freq: Once | INTRAVENOUS | Status: AC
  Administered 2024-09-21: 2000 mL via INTRAVENOUS

## 2024-09-21 NOTE — ED Provider Notes (Signed)
 Care of patient assumed from Dr. Franklyn.  This patient has newly diagnosed diabetes.  She has developed thrush and fungal vulvovaginitis.  She received 9 units of insulin.  Once hyperglycemia improves, she can be discharged. Physical Exam  BP 96/60   Pulse 98   Temp 99.8 F (37.7 C) (Oral)   Resp 18   Ht 5' 4 (1.626 m)   Wt 127.9 kg   LMP 08/22/2024   SpO2 96%   BMI 48.41 kg/m   Physical Exam Vitals and nursing note reviewed.  Constitutional:      General: She is not in acute distress.    Appearance: Normal appearance. She is well-developed. She is not ill-appearing, toxic-appearing or diaphoretic.  HENT:     Head: Normocephalic and atraumatic.     Right Ear: External ear normal.     Left Ear: External ear normal.     Nose: Nose normal.     Mouth/Throat:     Mouth: Mucous membranes are moist.  Eyes:     Extraocular Movements: Extraocular movements intact.     Conjunctiva/sclera: Conjunctivae normal.  Cardiovascular:     Rate and Rhythm: Normal rate and regular rhythm.  Pulmonary:     Effort: Pulmonary effort is normal. No respiratory distress.  Abdominal:     General: There is no distension.     Palpations: Abdomen is soft.     Tenderness: There is no abdominal tenderness.  Musculoskeletal:        General: No swelling. Normal range of motion.     Cervical back: Normal range of motion and neck supple.  Skin:    General: Skin is warm and dry.     Capillary Refill: Capillary refill takes less than 2 seconds.     Coloration: Skin is not jaundiced or pale.  Neurological:     General: No focal deficit present.     Mental Status: She is alert and oriented to person, place, and time.  Psychiatric:        Mood and Affect: Mood normal.        Behavior: Behavior normal.     Procedures  Procedures  ED Course / MDM   Clinical Course as of 09/21/24 0010  Sun Sep 20, 2024  2257 Will treat with Diflucan  200 mg dose here and then 100 mg once daily for 2 weeks to treat both  vulvovaginal candidiasis as well as oral thrush.  Also will prescribe 5 to milligrams metformin twice per day.  Patient is instructed to follow-up with her PCP within 1 week to discuss further treatment of her type 2 diabetes and obesity.  I discussed this at length with the patient.  She states she has many family members of diabetes and who have suffered amputations and so she is familiar with the consequences.  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  She will discuss with the physician about weight loss and type 2 diabetes management. [HN]  2309 Beta-Hydroxybutyric Acid: 0.14 neg [HN]    Clinical Course User Index [HN] Franklyn Sid SAILOR, MD   Medical Decision Making Amount and/or Complexity of Data Reviewed Labs: ordered. Decision-making details documented in ED Course.  Risk Prescription drug management.   On assessment, patient resting comfortably.  Blood sugar has improved to the range of 300.  She was discharged in stable condition.       Marci Polito,  Bernardino, MD 09/21/24 [phone]

## 2024-09-21 NOTE — Telephone Encounter (Signed)
 Copied from CRM [id number]. Topic: Clinical - Prescription Issue >> Sep 21, 2024  1:34 PM Harlene ORN wrote: Reason for CRM: Patient called. Requesting a referral for a device that checks and monitors her blood sugar levels. Please call back the patient to discuss.

## 2024-09-21 NOTE — Telephone Encounter (Signed)
 Scheduled and added to a waiting list

## 2024-09-22 LAB — GC/CHLAMYDIA PROBE AMP (~~LOC~~) NOT AT ARMC
Chlamydia: NEGATIVE
Comment: NEGATIVE
Comment: NORMAL
Neisseria Gonorrhea: NEGATIVE

## 2024-09-28 ENCOUNTER — Encounter: Payer: Self-pay | Admitting: Family Medicine

## 2024-09-28 ENCOUNTER — Ambulatory Visit: Payer: MEDICAID | Admitting: Family Medicine

## 2024-09-28 VITALS — BP 125/80 | HR 93 | Ht 64.0 in | Wt 280.0 lb

## 2024-09-28 DIAGNOSIS — Z7984 Long term (current) use of oral hypoglycemic drugs: Secondary | ICD-10-CM

## 2024-09-28 DIAGNOSIS — E1165 Type 2 diabetes mellitus with hyperglycemia: Secondary | ICD-10-CM

## 2024-09-28 DIAGNOSIS — E119 Type 2 diabetes mellitus without complications: Secondary | ICD-10-CM | POA: Insufficient documentation

## 2024-09-28 MED ORDER — TIRZEPATIDE 2.5 MG/0.5ML ~~LOC~~ SOAJ: 2.5000 mg | SUBCUTANEOUS | 0 refills | Status: DC

## 2024-09-28 MED ORDER — METFORMIN HCL 1000 MG PO TABS: 1000.0000 mg | ORAL_TABLET | Freq: Two times a day (BID) | ORAL | 3 refills | Status: AC

## 2024-09-28 NOTE — Assessment & Plan Note (Signed)
 Encouraged to start taking Mounjaro (tirzepatide) 2.5 mg once weekly and Metformin 1000 mg twice daily (BID)  Encouraged to monitor for:  GI side effects (nausea, vomiting, diarrhea, abdominal discomfort), especially after starting or increasing doses  Signs of hypoglycemia (dizziness, sweating, shakiness), particularly if the patient is on other glucose-lowering medications

## 2024-09-28 NOTE — Progress Notes (Signed)
 Established Patient Office Visit  Subjective:  Patient ID: Lisa Farrell, female    DOB: [birth year]  Age: 28 y.o. MRN: [id number]  CC:  Chief Complaint  Patient presents with   Follow-up    ER follow up for high blood sugar     HPI Lisa Farrell is a 28 y.o. female with past medical history of  T2DM presents for f/u of  chronic medical conditions. For the details of today's visit, please refer to the assessment and plan.   Past Medical History:  Diagnosis Date   BV (bacterial vaginosis) 05/21/2014   History of chlamydia    Patient desires pregnancy 01/27/2014   Vaginal discharge 01/27/2014    Past Surgical History:  Procedure Laterality Date   COLONOSCOPY WITH PROPOFOL  N/A 09/17/2023   Procedure: COLONOSCOPY WITH PROPOFOL ;  Surgeon: Cindie Carlin POUR, DO;  Location: [facility];  Service: Endoscopy;  Laterality: N/A;  9:30 am, asa 2   POLYPECTOMY  09/17/2023   Procedure: POLYPECTOMY;  Surgeon: Cindie Carlin POUR, DO;  Location: [facility];  Service: Endoscopy;;    Family History  Problem Relation Age of Onset   Diabetes Father    Bipolar disorder Brother     Social History   Socioeconomic History   Marital status: Single    Spouse name: Not on file   Number of children: Not on file   Years of education: Not on file   Highest education level: 11th grade  Occupational History   Not on file  Tobacco Use   Smoking status: Every Day    Current packs/day: 0.50    Types: Cigarettes    Passive exposure: Current   Smokeless tobacco: Never   Tobacco comments:    1/2 to 1 ppd  Vaping Use   Vaping status: Never Used  Substance and Sexual Activity   Alcohol use: No   Drug use: Yes    Frequency: 7.0 times per week    Types: Marijuana    Comment: quit 2022   Sexual activity: Yes    Birth control/protection: None  Other Topics Concern   Not on file  Social History Narrative   Not on file   Social Drivers of Health   Financial Resource Strain: Low  Risk (05/26/2024)   Received from [facility]   Overall Financial Resource Strain (CARDIA)    How hard is it for you to pay for the very basics like food, housing, medical care, and heating?: Not very hard  Food Insecurity: No Food Insecurity (05/26/2024)   Received from [facility]   Hunger Vital Sign    Within the past 12 months, you worried that your food would run out before you got the money to buy more.: Never true    Within the past 12 months, the food you bought just didn't last and you didn't have money to get more.: Never true  Transportation Needs: No Transportation Needs (05/26/2024)   Received from [facility]   PRAPARE - Transportation    Lack of Transportation (Medical): No    Lack of Transportation (Non-Medical): No  Physical Activity: Sufficiently Active (11/03/2023)   Exercise Vital Sign    Days of Exercise per Week: 7 days    Minutes of Exercise per Session: 30 min  Recent Concern: Physical Activity - Inactive (10/03/2023)   Received from [facility]   Exercise Vital Sign    On average, how many days per week do you engage in  moderate to strenuous exercise (like a brisk walk)?: 0 days    On average, how many minutes do you engage in exercise at this level?: 0 min  Stress: Stress Concern Present (11/03/2023)   [employer] of Occupational Health - Occupational Stress Questionnaire    Feeling of Stress : To some extent  Social Connections: Moderately Isolated (11/03/2023)   Social Connection and Isolation Panel    Frequency of Communication with Friends and Family: More than three times a week    Frequency of Social Gatherings with Friends and Family: Twice a week    Attends Religious Services: 1 to 4 times per year    Active Member of [employer] or Organizations: No    Attends Engineer, Structural: Not on file    Marital Status: Never married  Intimate Partner Violence: Not At Risk (05/01/2024)   Received from [facility]   Humiliation,  Afraid, Rape, and Kick questionnaire    Within the last year, have you been afraid of your partner or ex-partner?: No    Within the last year, have you been humiliated or emotionally abused in other ways by your partner or ex-partner?: No    Within the last year, have you been kicked, hit, slapped, or otherwise physically hurt by your partner or ex-partner?: No    Within the last year, have you been raped or forced to have any kind of sexual activity by your partner or ex-partner?: No    Outpatient Medications Prior to Visit  Medication Sig Dispense Refill   amoxicillin  (AMOXIL ) 875 MG tablet Take 875 mg by mouth 2 (two) times daily.     aspirin-acetaminophen -caffeine (EXCEDRIN EXTRA STRENGTH) 250-250-65 MG tablet Take 2 tablets by mouth every 6 (six) hours as needed.     fluconazole  (DIFLUCAN ) 100 MG tablet Take 1 tablet (100 mg total) by mouth daily for 14 days. 14 tablet 0   ibuprofen  (ADVIL ) 600 MG tablet Take 1 tablet (600 mg total) by mouth every 6 (six) hours as needed. 30 tablet 0   linaclotide  (LINZESS ) 145 MCG CAPS capsule Take 1 capsule (145 mcg total) by mouth daily before breakfast. 90 capsule 3   mirabegron  ER (MYRBETRIQ ) 25 MG TB24 tablet Take 1 tablet (25 mg total) by mouth daily. 90 tablet 1   mirabegron  ER (MYRBETRIQ ) 25 MG TB24 tablet Take 1 tablet by mouth daily.     mupirocin ointment (BACTROBAN) 2 % Apply 1 Application topically 2 (two) times daily.     naproxen  (NAPROSYN ) 500 MG tablet Take 1 tablet (500 mg total) by mouth 2 (two) times daily. 10 tablet 0   pantoprazole  (PROTONIX ) 20 MG tablet Take 1 tablet by mouth daily.     pantoprazole  (PROTONIX ) 20 MG tablet TAKE 1 TABLET(20 MG) BY MOUTH DAILY 60 tablet 1   propranolol  (INDERAL ) 10 MG tablet TAKE 1 TABLET(10 MG) BY MOUTH EVERY 12 HOURS AS NEEDED 60 tablet 1   metFORMIN (GLUCOPHAGE) 500 MG tablet Take 1 tablet (500 mg total) by mouth 2 (two) times daily with a meal. 60 tablet 2   norethindrone-ethinyl estradiol-FE  (LOESTRIN FE) 1-20 MG-MCG tablet Take 1 tablet by mouth daily.     No facility-administered medications prior to visit.    Allergies  Allergen Reactions   Ondansetron  Hcl Rash    ROS Review of Systems  Constitutional:  Negative for chills and fever.  Eyes:  Negative for visual disturbance.  Respiratory:  Negative for chest tightness and shortness of breath.  Endocrine: Positive for polydipsia, polyphagia and polyuria.  Neurological:  Negative for dizziness and headaches.      Objective:    Physical Exam HENT:     Head: Normocephalic.     Mouth/Throat:     Mouth: Mucous membranes are moist.  Cardiovascular:     Rate and Rhythm: Normal rate.     Heart sounds: Normal heart sounds.  Pulmonary:     Effort: Pulmonary effort is normal.     Breath sounds: Normal breath sounds.  Neurological:     Mental Status: She is alert.     BP 125/80   Pulse 93   Ht 5' 4 (1.626 m)   Wt 280 lb (127 kg)   LMP 08/22/2024   SpO2 97%   BMI 48.06 kg/m  Wt Readings from Last 3 Encounters:  09/28/24 280 lb (127 kg)  09/20/24 282 lb (127.9 kg)  03/22/24 266 lb 1.5 oz (120.7 kg)    Lab Results  Component Value Date   TSH 0.902 01/16/2021   Lab Results  Component Value Date   WBC 17.3 (H) 09/20/2024   HGB 14.8 09/20/2024   HCT 44.8 09/20/2024   MCV 94.3 09/20/2024   PLT 305 09/20/2024   Lab Results  Component Value Date   NA 131 (L) 09/20/2024   K 4.3 09/20/2024   CO2 24 09/20/2024   GLUCOSE 419 (H) 09/20/2024   BUN 15 09/20/2024   CREATININE 0.88 09/20/2024   BILITOT 0.2 09/20/2024   ALKPHOS 166 (H) 09/20/2024   AST 27 09/20/2024   ALT 48 (H) 09/20/2024   PROT 7.2 09/20/2024   ALBUMIN 4.1 09/20/2024   CALCIUM 9.4 09/20/2024   ANIONGAP 13 09/20/2024   Lab Results  Component Value Date   CHOL  07/18/2010    88        ATP III CLASSIFICATION:  <200     mg/dL   Desirable  799-760  mg/dL   Borderline High  >=759    mg/dL   High          Lab Results  Component  Value Date   HDL 23 (L) 07/18/2010   Lab Results  Component Value Date   LDLCALC  07/18/2010    51        Total Cholesterol/HDL:CHD Risk Coronary Heart Disease Risk Table                     Men   Women  1/2 Average Risk   3.4   3.3  Average Risk       5.0   4.4  2 X Average Risk   9.6   7.1  3 X Average Risk  23.4   11.0        Use the calculated Patient Ratio above and the CHD Risk Table to determine the patient's CHD Risk.        ATP III CLASSIFICATION (LDL):  <100     mg/dL   Optimal  899-870  mg/dL   Near or Above                    Optimal  130-159  mg/dL   Borderline  839-810  mg/dL   High  >809     mg/dL   Very High   Lab Results  Component Value Date   TRIG 69 07/18/2010   Lab Results  Component Value Date   CHOLHDL 3.8 07/18/2010   Lab Results  Component Value  Date   HGBA1C 10.1 (H) 09/20/2024      Assessment & Plan:  Type 2 diabetes mellitus with hyperglycemia, without long-term current use of insulin (HCC) Assessment & Plan: Encouraged to start taking Mounjaro (tirzepatide) 2.5 mg once weekly and Metformin 1000 mg twice daily (BID)  Encouraged to monitor for:  GI side effects (nausea, vomiting, diarrhea, abdominal discomfort), especially after starting or increasing doses  Signs of hypoglycemia (dizziness, sweating, shakiness), particularly if the patient is on other glucose-lowering medications   Orders: -     Tirzepatide; Inject 2.5 mg into the skin once a week.  Dispense: 2 mL; Refill: 0 -     metFORMIN HCl; Take 1 tablet (1,000 mg total) by mouth 2 (two) times daily with a meal.  Dispense: 180 tablet; Refill: 3 -     Amb Referral to Nutrition and Diabetic Education  Note: This chart has been completed using Engineer, Civil (consulting) software, and while attempts have been made to ensure accuracy, certain words and phrases may not be transcribed as intended.    Follow-up: Return in about 3 months (around 12/29/2024).   Ilai Hiller  Z Bacchus, FNP

## 2024-09-28 NOTE — Patient Instructions (Addendum)
 I appreciate the opportunity to provide care to you today!    Follow up: 3 months  Labs: next visit  Type 2 Diabetes -Start Mounjaro (tirzepatide) 2.5 mg once weekly -Start Metformin 1000 mg twice daily (BID)  Please monitor for:  GI side effects (nausea, vomiting, diarrhea, abdominal discomfort), especially after starting or increasing doses  Signs of hypoglycemia (dizziness, sweating, shakiness), particularly if the patient is on other glucose-lowering medications   Please follow up if your symptoms worsen or fail to improve.   Referrals today-  Diabetes education  Attached with your AVS, you will find valuable resources for self-education. I highly recommend dedicating some time to thoroughly examine them.   Please continue to a heart-healthy diet and increase your physical activities. Try to exercise for at least five days a week.    It was a pleasure to see you and I look forward to continuing to work together on your health and well-being. Please do not hesitate to call the office if you need care or have questions about your care.  In case of emergency, please visit the Emergency Department for urgent care, or contact our clinic at [phone] to schedule an appointment. We're here to help you!   Have a wonderful day and week. With Gratitude, Meade JENEANE Gerlach MSN, FNP-BC, PMHNP-BC

## 2024-09-29 ENCOUNTER — Telehealth: Payer: Self-pay | Admitting: Pharmacy Technician

## 2024-09-29 ENCOUNTER — Other Ambulatory Visit (HOSPITAL_COMMUNITY): Payer: Self-pay

## 2024-09-29 NOTE — Telephone Encounter (Signed)
 Pharmacy Patient Advocate Encounter   Received notification from CoverMyMeds that prior authorization for Mounjaro 2.5mg /0.69ml auto-injectors is required/requested.   Insurance verification completed.   The patient is insured through [employer].   Per test claim:  see below is preferred by the insurance.  If suggested medication is appropriate, Please send in a new RX and discontinue this one. If not, please advise as to why it's not appropriate so that we may request a Prior Authorization. Please note, some preferred medications may still require a PA.  If the suggested medications have not been trialed and there are no contraindications to their use, the PA will not be submitted, as it will not be approved.  Patient must have a trial/failure or contraindication to 2 preferred medications before she can be approved for Mounjaro.

## 2024-10-01 ENCOUNTER — Ambulatory Visit (HOSPITAL_BASED_OUTPATIENT_CLINIC_OR_DEPARTMENT_OTHER): Payer: MEDICAID | Attending: Primary Care | Admitting: Pulmonary Disease

## 2024-10-01 DIAGNOSIS — G4733 Obstructive sleep apnea (adult) (pediatric): Secondary | ICD-10-CM | POA: Diagnosis present

## 2024-10-07 NOTE — Telephone Encounter (Signed)
 Can we have an update on therapy change to a preferred medication?

## 2024-10-11 ENCOUNTER — Telehealth: Payer: Self-pay | Admitting: Pulmonary Disease

## 2024-10-11 NOTE — Procedures (Signed)
 [facility] [address] [place], [place] [address] Tel: [phone]   Fax: [phone]  Titration Interpretation  Patient Name:  Lisa Farrell, Lisa Farrell Date:  10/01/2024 Referring Physician:  ALMARIE FERRARI [phone]) %%startinterp%% Indications for Polysomnography The patient is a 28 year-old Female who is 5' 4 and weighs 280.0 lbs. Her BMI equals 48.4.  A full night titration treatment study was performed.  Medication  Metformin   Mirabegron    Polysomnogram Data A full night polysomnogram recorded the standard physiologic parameters including EEG, EOG, EMG, EKG, nasal and oral airflow.  Respiratory parameters of chest and abdominal movements were recorded with Respiratory Inductance Plethysmography belts.  Oxygen saturation was recorded by pulse oximetry.   Sleep Architecture The total recording time of the polysomnogram was 364.9 minutes.  The total sleep time was 335.5 minutes.  The patient spent 1.2% of total sleep time in Stage N1, 32.3% in Stage N2, 29.4% in Stages N3, and 37.1% in REM.  Sleep latency was 6.8 minutes.  REM latency was 37.0 minutes.  Sleep Efficiency was 91.9%.  Wake after Sleep Onset time was 23.0 minutes.  Titration Summary The patient was titrated at pressures ranging from 6* cm/H20 with supplemental oxygen at - up to 20/16/0** cm/H20 with supplemental oxygen at -.  The last pressure used in the study was 20/16/0** cm/H20 with supplemental oxygen at -.  Respiratory Events The polysomnogram revealed a presence of 6 obstructive, 8 central, and - mixed apneas resulting in an Apnea index of 2.5 events per hour.  There were 96 hypopneas (>=3% desaturation and/or arousal) resulting in an Apnea\Hypopnea Index (AHI >=3% desaturation and/or arousal) of 19.7 events per hour.  There were 38 hypopneas (>=4% desaturation) resulting in an Apnea\Hypopnea Index (AHI >=4% desaturation) of 9.3 events per hour.  There were - Respiratory  Effort Related Arousals resulting in a RERA index of - events per hour. The Respiratory Disturbance Index is 19.7 events per hour.  The snore index was 0.2 events per hour.  Mean oxygen saturation was 92.6%.  The lowest oxygen saturation during sleep was 83.0%.  Time spent <=88% oxygen saturation was 5.3 minutes (1.5%).  Limb Activity There were - limb movements recorded.  Of this total, - were classified as PLMs.  Of the PLMs, - were associated with arousals.  The Limb Movement index was - per hour while the PLM index was - per hour.  Cardiac Summary The average pulse rate was 83.5 bpm.  The minimum pulse rate was 60.0 bpm while the maximum pulse rate was 108.0 bpm.  Cardiac rhythm was normal/abnormal.  Comments: Patient with severe obstructive sleep apnea successfully titrated to BiPAP therapy, tolerated titration well  Diagnosis:  Optimal titration to BiPAP therapy for severe obstructive sleep apnea.  Final BiPAP pressures of 20/16 Sleep efficiency was excellent with good sleep architecture Optimal control of events at optimal pressures No significant periodic limb movement Cardiac rhythm was sinus  Recommendations: BiPAP 20/16 with heated humidification with patient's mask of choice Patient used an extra small Fisher and pico Evora fullface hybrid mask Avoid alcohol, sedatives and other CNS depressants that may worsen sleep apnea and disrupt normal sleep architecture. Sleep hygiene should be reviewed to assess factors that may improve sleep quality. Weight management and regular exercise should be initiated or continued  This study was personally reviewed and electronically signed by: Jennet Epley, MD Accredited Board Certified in Sleep Medicine Date/Time:   10/11/24   %%endinterp%%  Titration Report  Patient Name:  Lisa Farrell Study Date: 10/01/2024  Date of Birth: 1996-01-23 Study Type: Bilevel Titration  Age: 51 year MRN #: [id number]  Sex: Female Interpreting  Physician: NEDA HAMMOND, [id number]  Height: 5' 4 Referring Physician: ALMARIE FERRARI [phone])  Weight: 280.0 lbs Recording Tech: Holly Neeriemer RPSGT RST  BMI: 48.4 Scoring Tech: Holly Neeriemer RPSGT RST  ESS: 14 Neck Size: 18  Mask Type Fisher Paykel Evora Full Face hybrid Final Pressure: 20/16  Mask Size: X-Small Supplemental O2: N/A   Study Overview  Lights Off: 11:01:49 PM  Count Index  Lights On: 05:06:43 AM Awakenings: 8 1.4  Time in Bed: 364.9 min. Arousals: 36 6.4  Total Sleep Time: 335.5 min. AHI (>=3% Desat and/or Ar.): 110 19.7   Sleep Efficiency: 91.9% AHI (>=4% Desat): 52 9.3   Sleep Latency: 6.8 min. Limb Movements: - -  Wake After Sleep Onset: 23.0 min. Snore: 1 0.2  REM Latency from Sleep Onset: 37.0 min. Desaturations: 117 20.9     Minimum SpO2 TST: 83.0%    Sleep Architecture  % of Time in Bed Stages Time (mins) % Sleep Time  Wake 30.0   Stage N1 4.0 1.2%  Stage N2 108.5 32.3%  Stage N3 98.5 29.4%  REM 124.5 37.1%   Arousal Summary   NREM REM Sleep Index  Respiratory Arousals 9 3 12  2.1  PLM Arousals - - - -  Isolated Limb Movement Arousals - - - -  Snore Arousals - - - -  Spontaneous Arousals 20 4 24  4.3  Total 29 7 36 6.4   Limb Movement Summary   Count Index  Isolated Limb Movements - -  Periodic Limb Movements (PLMs) - -  Total Limb Movements - -    Respiratory Summary   By Sleep Stage By Body Position Total   NREM REM Supine Non-Supine   Time (min) 211.0 124.5 136.5 199.0 335.5         Obstructive Apnea 4 2 4 2 6   Mixed Apnea - - - - -  Central Apnea 4 4 3 5 8   Total Apneas 8 6 7 7 14   Total Apnea Index 2.3 2.9 3.1 2.1 2.5         Hypopneas (>=3% Desat and/or Ar.) 37 59 45 51 96  AHI (>=3% Desat and/or Ar.) 12.8 31.3 22.9 17.5 19.7         Hypopneas (>=4% Desat) 16 22 16 22  38  AHI (>=4% Desat) 6.8 13.5 10.1 8.7 9.3          RERAs - - - - -  RERA Index - - - - -         RDI 12.8 31.3 22.9 17.5 19.7    Respiratory  Event Type Index  Central Apneas 1.4  Obstructive Apneas 1.1  Mixed Apneas -  Central Hypopneas 0.4  Obstructive Hypopneas 16.8  Central Apnea + Hypopnea (CAHI) 1.8  Obstructive Apnea + Hypopnea (OAHI) 17.9   Respiratory Event Durations   Apnea Hypopnea   NREM REM NREM REM  Average (seconds) 11.7 10.4 15.4 14.8  Maximum (seconds) 13.4 11.4 25.6 24.3    Oxygen Saturation Summary   Wake NREM REM TST TIB  Average SpO2 95.1% 92.5% 92.4% 92.5% 92.6%  Minimum SpO2 89.0% 88.0% 83.0% 83.0% 83.0%  Maximum SpO2 98.0% 98.0% 98.0% 98.0% 98.0%   Oxygen Saturation Distribution  Range (%) Time in range (min) Time in range (%)  90.0 - 100.0 332.7 93.7%  80.0 - 90.0 22.3 6.3%  70.0 - 80.0 - -  60.0 - 70.0 - -  50.0 - 60.0 - -  0.0 - 50.0 - -  Time Spent <=88% SpO2  Range (%) Time in range (min) Time in range (%)  0.0 - 88.0 5.3 1.5%      Count Index  Desaturations 117 20.9    Cardiac Summary   Wake NREM REM Sleep Total  Average Pulse Rate (BPM) 89.5 82.6 84.0 83.1 83.5  Minimum Pulse Rate (BPM) 68.0 62.0 60.0 60.0 60.0  Maximum Pulse Rate (BPM) 107.0 108.0 101.0 108.0 108.0   Pulse Rate Distribution:  Range (bpm) Time in range (min) Time in range (%)  0.0 - 40.0 - -  40.0 - 60.0 0.1 0.0%  60.0 - 80.0 78.6 22.1%  80.0 - 100.0 275.0 77.3%  100.0 - 120.0 2.1 0.6%  120.0 - 140.0 - -  140.0 - 200.0 - -   Titration Summary  PAP Device PAP Level O2 Level Time (min) Wake (min) NREM (min) REM (min) Supine TST (min) Sleep Eff% OA# CA# MA# Hyp# (>=3%) AHI (>=3%) Hyp# (>=4%) AHI (>=%4) RERA RDI SpO2 <=88% (min) Min SpO2 Mean SpO2 Ar. Index  - Off - 0.5 0.5 0.0 0.0  0.0%               CPAP 6 - 9.5 6.5 3.0 0.0  31.6% - - - 9 180.0 5  100.0 -  180.0  0.0 89.0 92.5 -  CPAP 8 - 8.5 0.0 8.5 0.0  100.0% - - - 4 28.2 1  7.1 -  28.2  0.1 88.0 90.3 -  CPAP 9 - 6.0 0.0 6.0 0.0  100.0% - - - 2 20.0 2  20.0 -  20.0  0.0 90.0 91.2 10.0  CPAP 10 - 17.5 0.0 17.5 0.0  100.0% - - - 7 24.0 3   10.3 -  24.0  0.0 88.0 91.4 3.4  CPAP 12 - 8.5 0.0 2.0 6.5  100.0% - - - 8 56.5 6  42.4 -  56.5  3.3 83.0 89.4 14.1  CPAP 13 - 12.0 1.0 7.0 4.0  91.7% - - - 4 21.8 1  5.5 -  21.8  1.6 83.0 91.1 5.5  CPAP 14 - 67.5 2.0 65.5 0.0  97.0% - 4 - 6 9.2 1  4.6 -  9.2  0.0 90.0 92.4 4.6  CPAP 15 - 10.0 0.0 5.5 4.5  8.5 100.0% 3 - - 9 72.0 5  48.0 -  72.0  0.0 88.0 92.9 42.0  CPAP 16 - 9.5 0.0 0.0 9.5  9.5 100.0% - - - 8 50.5 1  6.3 -  50.5  0.0 88.0 92.1 -  CPAP 17 - 22.0 0.0 0.0 22.0  22.0 100.0% - - - 10 27.3 2  5.5 -  27.3  0.0 89.0 92.2 -  CPAP 18 - 27.5 0.0 0.0 27.5  27.5 100.0% - 2 - 12 30.5 5  15.3 -  30.5  0.1 87.0 92.9 -  BiLevel 18/14/0 - 7.0 0.0 0.0 7.0  7.0 100.0% - - - 2 17.1 1  8.6 -  17.1  0.1 88.0 92.6 -  BiLevel 19/15/0 - 88.5 18.5 51.0 19.0  52.5 79.1% 3 1 - 8 10.3 3  6.0 -  10.3  0.0 88.0 92.6 7.7  BiLevel 20/16/0 - 71.0 1.5 45.0 24.5  9.5 97.9% - 1 - 7 6.9 2  2.6 -  6.9  0.0 88.0 93.5 8.6   Hypnograms  Technologist Comments  The patient is a 28 y/o female seen in the [facility] for a CPAP titration. Her associated dx is severe OSA. The study was performed in sleep room #2.   The patient arrived without complaint. She was explained the study process and fitted for a mask before study was started. The patient reports using CPAP at home with difficulty due to leaks with the masks she has tried.  I fitted her for a [employer] full face hybrid style mask. Size: X-Small.  This mask fit her very well and sealed very well. This was used for the entire study.   The patient was started on CPAP 6 and quickly increased for events and snore. The patient was recorded sleeping lateral and supine. Events were noted to be worse/more frequent in stage REM. Patient had a long REM period in the middle of the study.  Highest CPAP pressure used was 18 cm/H2O. Due to continued hypops and snores titration was switched over to Bilevel for need of higher  pressure settings and range.  Bilevel was started at 18/14 and highest and final pressure was 20/16 cm/H2O.  The patient tolerated CPAP and BiPAP therapy very well.  She reported feeling Awesome when she left the lab.   Restroom visits: 1 ECG: Appeared to be Sinus Rhythm via 2 lead monitoring. PLMs/PLMAs: rare No supplemental O2 was used. No parasomnias were observed.  REM periods: 4

## 2024-10-11 NOTE — Telephone Encounter (Signed)
 Call patient  Sleep study result  Date of study: 10/01/2024  Impression: Severe obstructive sleep apnea adequately treated with BiPAP therapy, tolerated titration well  BiPAP 20/16 with heated humidification with patient's mask of choice Patient used an extra small Fisher and pico Evora fullface hybrid mask  Auto-titrating CPAP with pressure settings of 5-15 should be appropriate, along with heated humidification using the patient's preferred mask.  Encourage weight loss measures.  Schedule follow-up in the office 4 to 6 weeks after initiation of treatment

## 2024-10-11 NOTE — Procedures (Signed)
  Indications for Polysomnography The patient is a 28 year-old Female who is 5' 4 and weighs 280.0 lbs. Her BMI equals 48.4.  A full night titration treatment study was performed.  MedicationMetforminMirabegron Polysomnogram Data A full night polysomnogram recorded the standard physiologic parameters including EEG, EOG, EMG, EKG, nasal and oral airflow.  Respiratory parameters of chest and abdominal movements were recorded with Respiratory Inductance Plethysmography belts.   Oxygen saturation was recorded by pulse oximetry.  Sleep Architecture The total recording time of the polysomnogram was 364.9 minutes.  The total sleep time was 335.5 minutes.  The patient spent 1.2% of total sleep time in Stage N1, 32.3% in Stage N2, 29.4% in Stages N3, and 37.1% in REM.  Sleep latency was 6.8 minutes.   REM latency was 37.0 minutes.  Sleep Efficiency was 91.9%.  Wake after Sleep Onset time was 23.0 minutes.  Titration Summary The patient was titrated at pressures ranging from 6* cm/H20 with supplemental oxygen at - up to 20/16/0** cm/H20 with supplemental oxygen at -.  The last pressure used in the study was 20/16/0** cm/H20 with supplemental oxygen at -.  Respiratory Events The polysomnogram revealed a presence of 6 obstructive, 8 central, and - mixed apneas resulting in an Apnea index of 2.5 events per hour.  There were 96 hypopneas (GreaterEqual to3% desaturation and/or arousal) resulting in an Apnea\Hypopnea Index (AHI  GreaterEqual to3% desaturation and/or arousal) of 19.7 events per hour.  There were 38 hypopneas (GreaterEqual to4% desaturation) resulting in an Apnea\Hypopnea Index (AHI GreaterEqual to4% desaturation) of 9.3 events per hour.  There were - Respiratory  Effort Related Arousals resulting in a RERA index of - events per hour. The Respiratory Disturbance Index is 19.7 events per hour.  The snore index was 0.2 events per hour.  Mean oxygen saturation was 92.6%.  The lowest oxygen  saturation during sleep was 83.0%.  Time spent LessEqual to88% oxygen saturation was  minutes ().  Limb Activity There were - limb movements recorded.  Of this total, - were classified as PLMs.  Of the PLMs, - were associated with arousals.  The Limb Movement index was - per hour while the PLM index was - per hour.  Cardiac Summary The average pulse rate was 83.5 bpm.  The minimum pulse rate was 60.0 bpm while the maximum pulse rate was 108.0 bpm.  Cardiac rhythm was normal/abnormal.  Comments: Patient with severe obstructive sleep apnea successfully titrated to BiPAP therapy, tolerated titration well  Diagnosis: Optimal titration to BiPAP therapy for severe obstructive sleep apnea.  Final BiPAP pressures of 20/16 Sleep efficiency was excellent with good sleep architecture Optimal control of events at optimal pressures No significant periodic limb movement Cardiac rhythm was sinus  Recommendations: BiPAP 20/16 with heated humidification with patient's mask of choice Patient used an extra small Fisher and pico Evora fullface hybrid mask Avoid alcohol, sedatives and other CNS depressants that may worsen sleep apnea and disrupt normal sleep architecture. Sleep hygiene should be reviewed to assess factors that may improve sleep quality. Weight management and regular exercise should be initiated or continued  This study was personally reviewed and electronically signed by: Jennet Epley, MD Accredited Board Certified in Sleep Medicine Date/Time:  10/11/24

## 2024-10-12 ENCOUNTER — Other Ambulatory Visit: Payer: Self-pay | Admitting: *Deleted

## 2024-10-12 DIAGNOSIS — G4733 Obstructive sleep apnea (adult) (pediatric): Secondary | ICD-10-CM

## 2024-10-12 NOTE — Telephone Encounter (Signed)
 Please let patient know we need to change her settings form CPAP to BIPAP (bi-level therapy)   Please place an order for BIPAP with pressure settings 20/16 with heated humidification with extra small Fisher and pico Evora fullface hybrid mask

## 2024-10-12 NOTE — Telephone Encounter (Signed)
 I called and spoke with patient, provided with results of sleep study and recommendations per Dr. Neda and Landry.  She verified understanding.  She will call to schedule f/u when she received Bipap.  She was instructed to keep using CPAP until she receives BIPAP machine.  Nothing further needed.

## 2024-10-13 ENCOUNTER — Other Ambulatory Visit: Payer: Self-pay | Admitting: Family Medicine

## 2024-10-13 DIAGNOSIS — E1165 Type 2 diabetes mellitus with hyperglycemia: Secondary | ICD-10-CM

## 2024-10-13 MED ORDER — OZEMPIC (0.25 OR 0.5 MG/DOSE) 2 MG/3ML ~~LOC~~ SOPN: 0.2500 mg | PEN_INJECTOR | SUBCUTANEOUS | 0 refills | Status: AC

## 2024-10-13 NOTE — Telephone Encounter (Signed)
 Please inform the patient that a prescription for Ozempic 0.25 mg weekly has been sent to the pharmacy, as this medication is on the list of preferred options.

## 2024-10-14 NOTE — Telephone Encounter (Signed)
 Patient advised.

## 2024-10-16 ENCOUNTER — Encounter (HOSPITAL_COMMUNITY): Payer: Self-pay | Admitting: *Deleted

## 2024-10-16 ENCOUNTER — Other Ambulatory Visit: Payer: Self-pay

## 2024-10-16 ENCOUNTER — Emergency Department (HOSPITAL_COMMUNITY)
Admission: EM | Admit: 2024-10-16 | Discharge: 2024-10-16 | Disposition: A | Discharge status: Home / Self Care | Payer: MEDICAID

## 2024-10-16 DIAGNOSIS — L0291 Cutaneous abscess, unspecified: Secondary | ICD-10-CM

## 2024-10-16 LAB — CBG MONITORING, ED: Glucose-Capillary: 163 mg/dL — ABNORMAL HIGH (ref 70–99)

## 2024-10-16 MED ORDER — LIDOCAINE HCL (PF) 2 % IJ SOLN
INTRAMUSCULAR | Status: AC
  Filled 2024-10-16: qty 5

## 2024-10-16 MED ORDER — OXYCODONE-ACETAMINOPHEN 5-325 MG PO TABS
1.0000 | ORAL_TABLET | Freq: Once | ORAL | Status: AC
  Administered 2024-10-16: 1 via ORAL
  Filled 2024-10-16: qty 1

## 2024-10-16 MED ORDER — LIDOCAINE-EPINEPHRINE-TETRACAINE (LET) TOPICAL GEL
3.0000 mL | Freq: Once | TOPICAL | Status: AC
  Administered 2024-10-16: 3 mL via TOPICAL
  Filled 2024-10-16: qty 3

## 2024-10-16 MED ORDER — POVIDONE-IODINE 10 % EX SOLN
CUTANEOUS | Status: DC | PRN
  Filled 2024-10-16: qty 14.8

## 2024-10-16 MED ORDER — LIDOCAINE HCL (PF) 2 % IJ SOLN
5.0000 mL | Freq: Once | INTRAMUSCULAR | Status: AC
  Administered 2024-10-16: 5 mL via INTRADERMAL

## 2024-10-16 MED ORDER — DOXYCYCLINE HYCLATE 100 MG PO CAPS: 100.0000 mg | ORAL_CAPSULE | Freq: Two times a day (BID) | ORAL | 0 refills | Status: AC

## 2024-10-16 MED ORDER — DOXYCYCLINE HYCLATE 100 MG PO TABS
100.0000 mg | ORAL_TABLET | Freq: Once | ORAL | Status: AC
  Administered 2024-10-16: 100 mg via ORAL
  Filled 2024-10-16: qty 1

## 2024-10-16 MED ORDER — HYDROCODONE-ACETAMINOPHEN 5-325 MG PO TABS: ORAL_TABLET | ORAL | 0 refills | Status: AC

## 2024-10-16 NOTE — ED Triage Notes (Signed)
 Pt with abscess to groin on left and right x 2 days. Unknown of fevers. Denies any drainage. Emesis x 1 this morning.

## 2024-10-16 NOTE — Discharge Instructions (Signed)
 Continue warm water  soaks 2-3 times a day.  The packing will need to be removed in 2 days.  You may remove it yourself if you feel comfortable doing this.  If you have any worsening symptoms including increasing pain fever or chills please return to the emergency department.  Otherwise, follow-up with your OB/GYN next week for recheck.  Your blood sugar here today was 163

## 2024-10-16 NOTE — ED Provider Notes (Signed)
 [place] EMERGENCY DEPARTMENT AT [facility] Provider Note   CSN: [id number] Arrival date & time: 10/16/24  1122     Patient presents with: Abscess   Lisa Farrell is a 28 y.o. female.    Abscess      Lisa Farrell is a 28 y.o. female who presents to the Emergency Department complaining of pain swelling and boil to her left groin vaginal area.  Symptoms present for 2 days.  Gradually increasing in size.  She denies any redness or drainage to the area.  She states that she had a boil in the same area several weeks ago that spontaneously resolved after warm compresses and soaks.  Symptoms returned.  She denies any pain to her abdomen she had 1 episode of vomiting this morning but denies any nausea or persistent vomiting.  She has tolerated oral liquids without difficulty since.  States that she is recently diagnosed with diabetes and started on metformin .  She also has a smaller less tender area to her right labia.  There is no redness or drainage to this area  Prior to Admission medications   Medication Sig Start Date End Date Taking? Authorizing Provider  amoxicillin  (AMOXIL ) 875 MG tablet Take 875 mg by mouth 2 (two) times daily. 01/12/24   [provider]  aspirin-acetaminophen -caffeine (EXCEDRIN EXTRA STRENGTH) 250-250-65 MG tablet Take 2 tablets by mouth every 6 (six) hours as needed.    [provider]  ibuprofen  (ADVIL ) 600 MG tablet Take 1 tablet (600 mg total) by mouth every 6 (six) hours as needed. 07/06/23   Suellen Cantor A, PA-C  linaclotide  (LINZESS ) 145 MCG CAPS capsule Take 1 capsule (145 mcg total) by mouth daily before breakfast. 10/16/23 10/15/24  Cindie Carlin POUR, DO  metFORMIN  (GLUCOPHAGE ) 1000 MG tablet Take 1 tablet (1,000 mg total) by mouth 2 (two) times daily with a meal. 09/28/24   Bacchus, Meade PEDLAR, FNP  mirabegron  ER (MYRBETRIQ ) 25 MG TB24 tablet Take 1 tablet (25 mg total) by mouth daily. 09/18/23   Bacchus, Meade PEDLAR, FNP   mirabegron  ER (MYRBETRIQ ) 25 MG TB24 tablet Take 1 tablet by mouth daily. 09/18/23   [provider]  mupirocin ointment (BACTROBAN) 2 % Apply 1 Application topically 2 (two) times daily. 09/25/23   [provider]  naproxen  (NAPROSYN ) 500 MG tablet Take 1 tablet (500 mg total) by mouth 2 (two) times daily. 03/22/24   Suellen Cantor A, PA-C  pantoprazole  (PROTONIX ) 20 MG tablet Take 1 tablet by mouth daily. 09/03/23   [provider]  pantoprazole  (PROTONIX ) 20 MG tablet TAKE 1 TABLET(20 MG) BY MOUTH DAILY 12/26/23   Bacchus, Gloria Z, FNP  propranolol  (INDERAL ) 10 MG tablet TAKE 1 TABLET(10 MG) BY MOUTH EVERY 12 HOURS AS NEEDED 01/22/24   Del Wilhelmena Lloyd Sola, FNP  Semaglutide ,0.25 or 0.5MG /DOS, (OZEMPIC , 0.25 OR 0.5 MG/DOSE,) 2 MG/3ML SOPN Inject 0.25 mg into the skin once a week. 10/13/24   Bacchus, Meade PEDLAR, FNP    Allergies: Ondansetron  hcl    Review of Systems  Genitourinary:  Negative for vaginal discharge.       Localized area of swelling pain left groin/labia  Psychiatric/Behavioral:  Negative for dysphoric mood.   All other systems reviewed and are negative.   Updated Vital Signs BP 126/72 (BP Location: Right Arm)   Pulse (!) 105   Temp 97.9 F (36.6 C) (Oral)   Resp 18   Ht 5' 4 (1.626 m)   Wt 127.9 kg  LMP 08/22/2024   SpO2 98%   BMI 48.41 kg/m   Physical Exam Vitals and nursing note reviewed.  Constitutional:      General: She is not in acute distress.    Appearance: Normal appearance. She is not toxic-appearing.  HENT:     Mouth/Throat:     Mouth: Mucous membranes are moist.  Cardiovascular:     Rate and Rhythm: Normal rate and regular rhythm.     Pulses: Normal pulses.  Pulmonary:     Effort: Pulmonary effort is normal.  Abdominal:     Palpations: Abdomen is soft.     Tenderness: There is no abdominal tenderness.  Genitourinary:    Labia:        Left: Tenderness present.      Comments:  Localized area of left labia with  fluctuance and erythema.  No drainage.  No swelling of vaginal wall, no Bartholin's abscess Musculoskeletal:        General: Normal range of motion.  Skin:    General: Skin is warm.     Capillary Refill: Capillary refill takes less than 2 seconds.     Findings: No erythema.  Neurological:     General: No focal deficit present.     Mental Status: She is alert.     Sensory: No sensory deficit.     Motor: No weakness.     (all labs ordered are listed, but only abnormal results are displayed) Labs Reviewed - No data to display  EKG: None  Radiology: No results found.   Procedures   INCISION AND DRAINAGE Performed by: Loralye Loberg Consent: Verbal consent obtained. Risks and benefits: risks, benefits and alternatives were discussed Type: abscess  Body area: left labia majora  Anesthesia: local infiltration  Incision was made with a #11 scalpel.  Local anesthetic: lidocaine  2% w/o epinephrine   Anesthetic total: 4 ml  Complexity: complex Blunt dissection to break up loculations  Drainage: purulent  Drainage amount: moderate  Packing material: 1/4 in iodoform gauze  Patient tolerance: Patient tolerated the procedure well with no immediate complications.    Medications Ordered in the ED  povidone-iodine  (BETADINE ) 10 % external solution (has no administration in time range)  doxycycline  (VIBRA -TABS) tablet 100 mg (has no administration in time range)  lidocaine -EPINEPHrine -tetracaine  (LET) topical gel (3 mLs Topical Given 10/16/24 1224)  lidocaine  HCl (PF) (XYLOCAINE ) 2 % injection 5 mL (5 mLs Intradermal Given 10/16/24 1245)  oxyCODONE -acetaminophen  (PERCOCET/ROXICET) 5-325 MG per tablet 1 tablet (1 tablet Oral Given 10/16/24 1227)  lidocaine  HCl (PF) (XYLOCAINE ) 2 % injection (  Given 10/16/24 1246)                                    Medical Decision Making   Patient here recently diagnosed with diabetes abscess to left labia area.  Had similar symptoms in  the area few weeks ago that spontaneously resolved.  Denies any abdominal pain fever or chills.  No dysuria symptoms vaginal pain or bleeding.  Symptoms seem to localize.  No soft tissue crepitus on exam.  Patient very well-appearing.  I suspect developing abscess.  There is some central fluctuance.  Discussed treatment plan with patient and she is agreeable to proceed with I&D.  No evidence of Bartholin's glans cyst/abscess  Amount and/or Complexity of Data Reviewed Labs: ordered.    Details: Cbg 163 Discussion of management or test interpretation with external provider(s):   Successful  I&D will start on antibiotics she is agreeable to warm soaks and packing removal in 2 days.  I recommended close outpatient follow-up with her OB/GYN and return here for any worsening symptoms will treat with short course of pain medication and doxycycline   Risk OTC drugs. Prescription drug management.        Final diagnoses:  Abscess    ED Discharge Orders     None          Herlinda Milling, PA-C 10/18/24 1503    Gennaro Bouchard L, DO 10/20/24 1757

## 2024-11-02 ENCOUNTER — Encounter: Payer: MEDICAID | Admitting: Nutrition

## 2025-01-06 ENCOUNTER — Ambulatory Visit: Payer: MEDICAID | Admitting: Family Medicine
